# Patient Record
Sex: Female | Born: 1950 | Race: White | Hispanic: No | Marital: Married | Smoking: Never smoker
Health system: Southern US, Community
[De-identification: ages and names within clinical notes are randomized; demographics above are authoritative.]

## PROBLEM LIST (undated history)

## (undated) DIAGNOSIS — N393 Stress incontinence (female) (male): Secondary | ICD-10-CM

## (undated) DIAGNOSIS — D649 Anemia, unspecified: Secondary | ICD-10-CM

## (undated) DIAGNOSIS — M1990 Unspecified osteoarthritis, unspecified site: Secondary | ICD-10-CM

## (undated) DIAGNOSIS — Z9229 Personal history of other drug therapy: Secondary | ICD-10-CM

## (undated) DIAGNOSIS — I10 Essential (primary) hypertension: Secondary | ICD-10-CM

## (undated) HISTORY — DX: Personal history of other drug therapy: Z92.29

## (undated) HISTORY — DX: Essential (primary) hypertension: I10

## (undated) HISTORY — PX: SPINE SURGERY: SHX786

## (undated) HISTORY — DX: Anemia, unspecified: D64.9

## (undated) HISTORY — DX: Unspecified osteoarthritis, unspecified site: M19.90

## (undated) HISTORY — PX: ABDOMINAL HYSTERECTOMY: SHX81

## (undated) HISTORY — PX: LAMINECTOMY: SHX219

## (undated) HISTORY — PX: OOPHORECTOMY: SHX86

## (undated) HISTORY — DX: Stress incontinence (female) (male): N39.3

---
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
## 1980-07-13 HISTORY — PX: BREAST ENHANCEMENT SURGERY: SHX7

## 1980-07-13 HISTORY — PX: PARTIAL HYSTERECTOMY: SHX80

## 1989-07-13 HISTORY — PX: CHOLECYSTECTOMY: SHX55

## 1997-07-13 HISTORY — PX: BLADDER REPAIR: SHX76

## 2002-07-13 HISTORY — PX: BLADDER REPAIR: SHX76

## 2002-10-24 ENCOUNTER — Encounter: Admission: RE | Admit: 2002-10-24 | Discharge: 2002-10-24 | Payer: Self-pay | Admitting: Family Medicine

## 2002-10-24 ENCOUNTER — Encounter: Payer: Self-pay | Admitting: Family Medicine

## 2007-07-14 LAB — HM COLONOSCOPY

## 2009-12-30 ENCOUNTER — Encounter: Admission: RE | Admit: 2009-12-30 | Discharge: 2009-12-30 | Payer: Self-pay | Admitting: Family Medicine

## 2010-11-12 ENCOUNTER — Other Ambulatory Visit: Payer: Self-pay | Admitting: Family Medicine

## 2010-11-12 DIAGNOSIS — M545 Low back pain, unspecified: Secondary | ICD-10-CM

## 2010-11-13 ENCOUNTER — Ambulatory Visit
Admission: RE | Admit: 2010-11-13 | Discharge: 2010-11-13 | Disposition: A | Discharge status: Home / Self Care | Payer: BC Managed Care – PPO | Source: Ambulatory Visit | Attending: Family Medicine | Admitting: Family Medicine

## 2010-11-13 DIAGNOSIS — M545 Low back pain: Secondary | ICD-10-CM

## 2010-11-14 ENCOUNTER — Other Ambulatory Visit: Payer: Self-pay

## 2012-11-09 ENCOUNTER — Other Ambulatory Visit (HOSPITAL_COMMUNITY): Payer: Self-pay | Admitting: Family Medicine

## 2012-11-11 ENCOUNTER — Ambulatory Visit
Admission: RE | Admit: 2012-11-11 | Discharge: 2012-11-11 | Disposition: A | Discharge status: Home / Self Care | Payer: BC Managed Care – PPO | Source: Ambulatory Visit | Attending: Family Medicine | Admitting: Family Medicine

## 2012-11-11 ENCOUNTER — Encounter: Payer: BC Managed Care – PPO | Admitting: Internal Medicine

## 2012-11-11 ENCOUNTER — Ambulatory Visit (INDEPENDENT_AMBULATORY_CARE_PROVIDER_SITE_OTHER): Payer: BC Managed Care – PPO | Admitting: Internal Medicine

## 2012-11-11 DIAGNOSIS — Z23 Encounter for immunization: Secondary | ICD-10-CM
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
## 2013-05-26 ENCOUNTER — Other Ambulatory Visit (HOSPITAL_COMMUNITY): Payer: Self-pay | Admitting: Family Medicine

## 2013-05-26 ENCOUNTER — Ambulatory Visit
Admission: RE | Admit: 2013-05-26 | Discharge: 2013-05-26 | Disposition: A | Discharge status: Home / Self Care | Payer: BC Managed Care – PPO | Source: Ambulatory Visit | Attending: Family Medicine | Admitting: Family Medicine

## 2013-05-26 DIAGNOSIS — R519 Headache, unspecified: Secondary | ICD-10-CM

## 2013-05-26 DIAGNOSIS — R42 Dizziness and giddiness: Secondary | ICD-10-CM

## 2013-05-26 DIAGNOSIS — H9201 Otalgia, right ear: Secondary | ICD-10-CM

## 2013-05-26 IMAGING — CT CT HEAD WO/W CM
1 of 2 series · 13 of 30 positions shown, 17 images · IV contrast (omnipaque)
Comparison: None.

CLINICAL DATA: Vertigo.  Facial pain.

EXAM:
CT HEAD WITHOUT AND WITH CONTRAST
TECHNIQUE: Contiguous axial images were obtained from the base of the skull
through the vertex without and with intravenous contrast
CONTRAST:  75mL OMNIPAQUE IOHEXOL 300 MG/ML  SOLN

[Series 2: head w/o · axial · non-contrast · 0.44mm/px · z∈[+31,+166]mm · 13 of 32 slices shown, 17 images]
[im 3/32  brain]
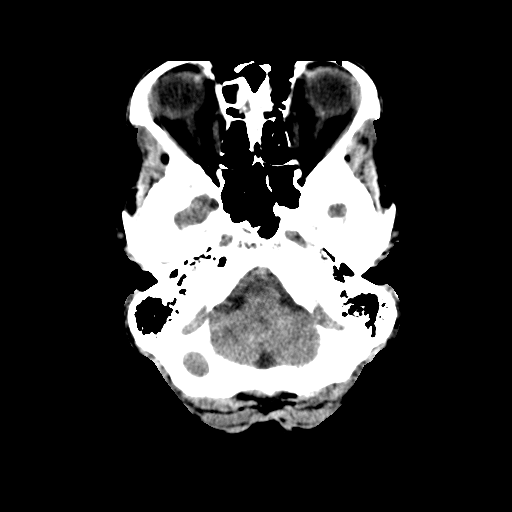
[im 3/32  bone]
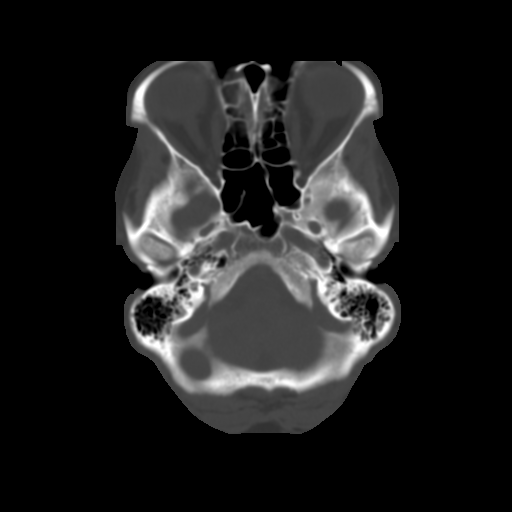
[im 5/32  brain]
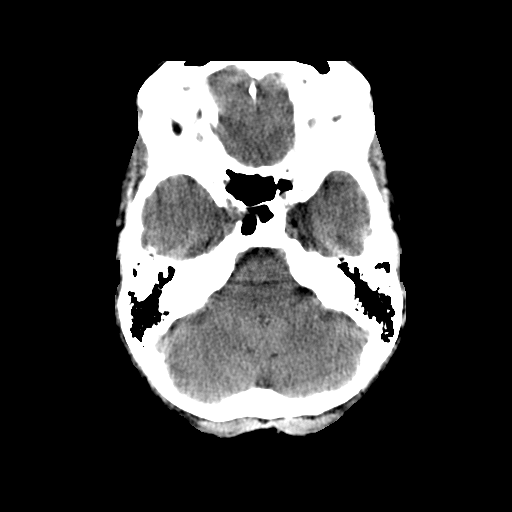
[im 7/32  brain]
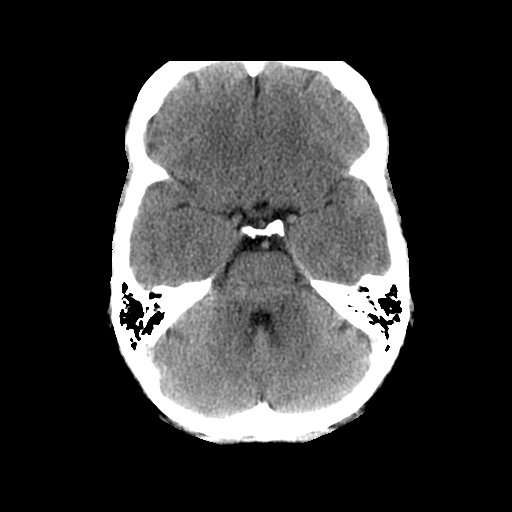
[im 9/32  brain]
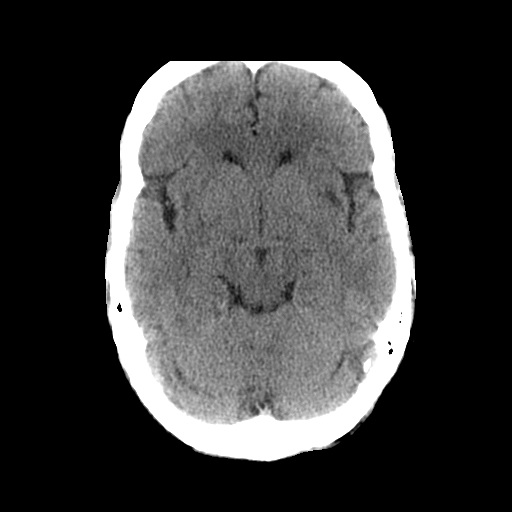
[im 12/32  brain]
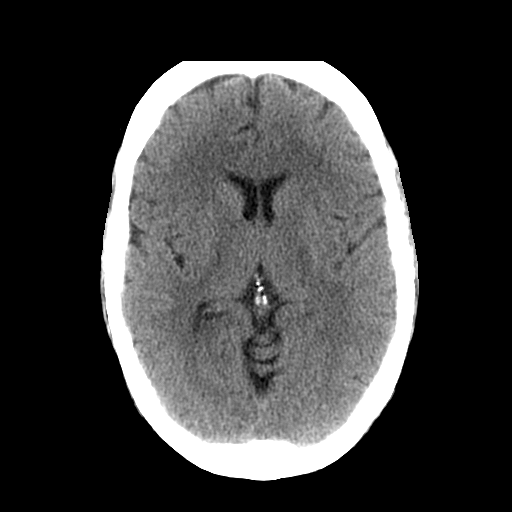
[im 12/32  bone]
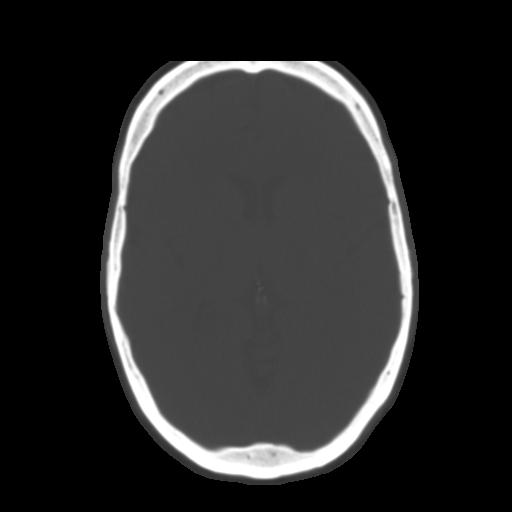
[im 14/32  brain]
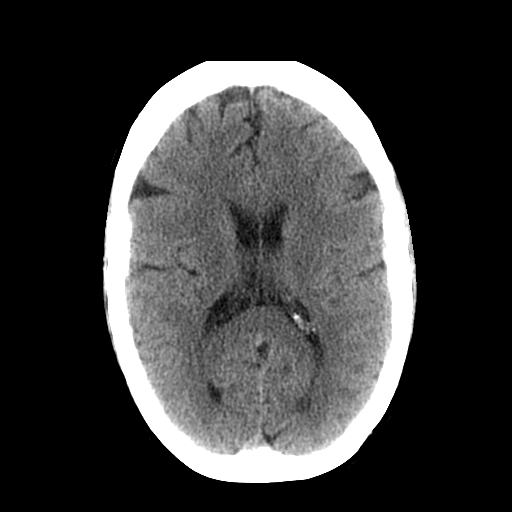
[im 16/32  brain]
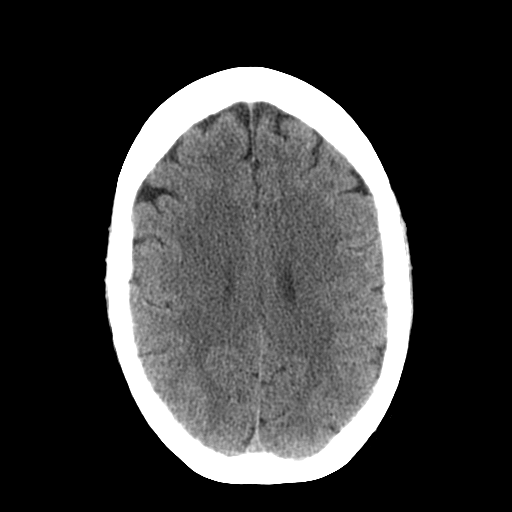
[im 18/32  brain]
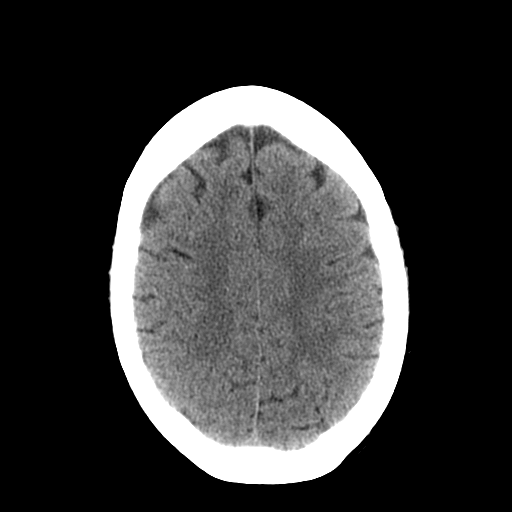
[im 20/32  brain]
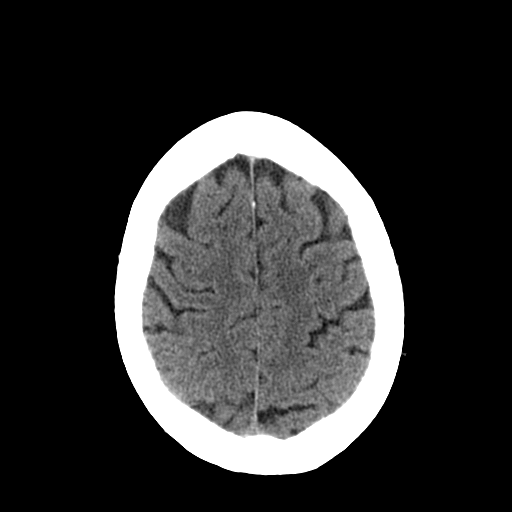
[im 20/32  bone]
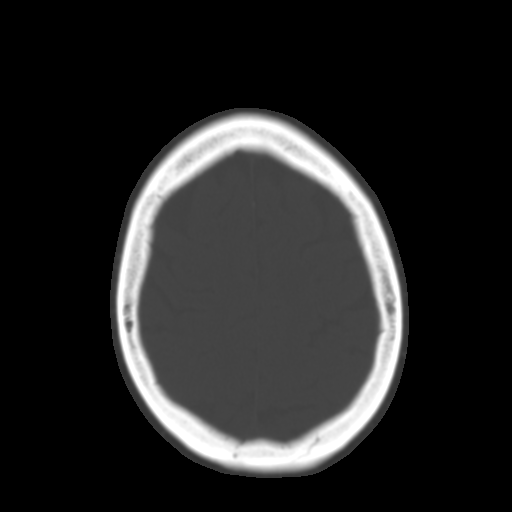
[im 23/32  brain]
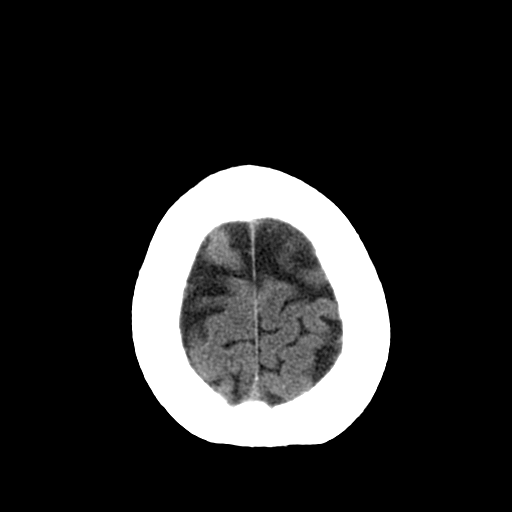
[im 25/32  brain]
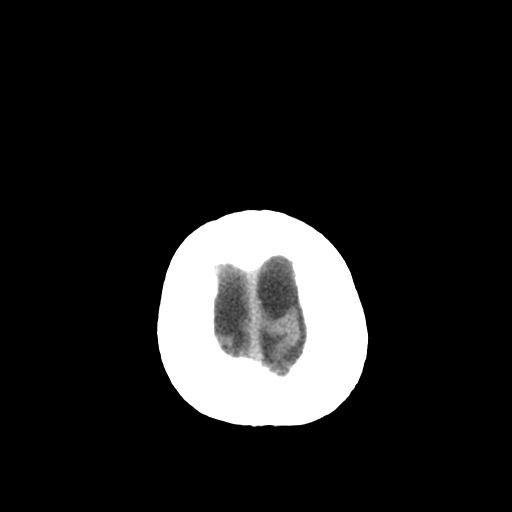
[im 27/32  brain]
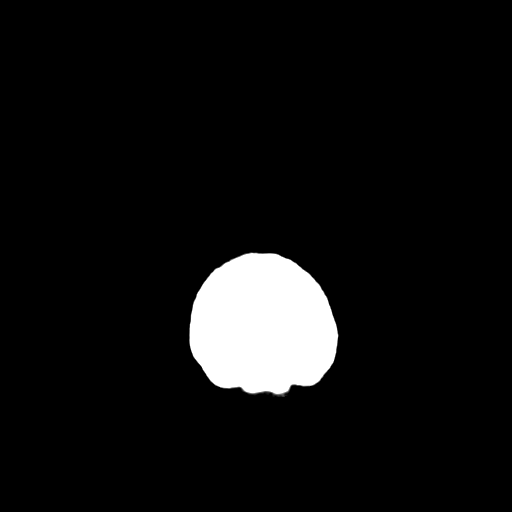
[im 29/32  brain]
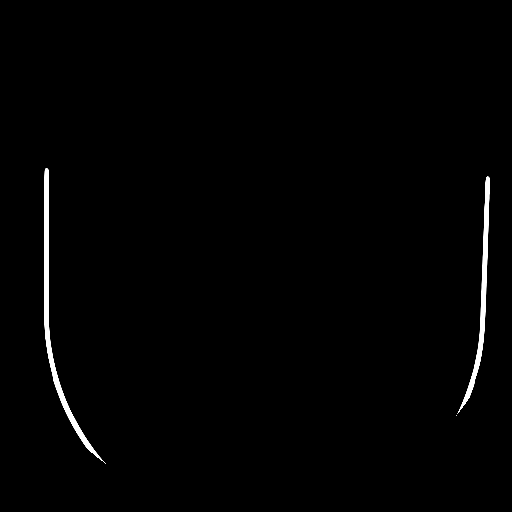
[im 29/32  bone]
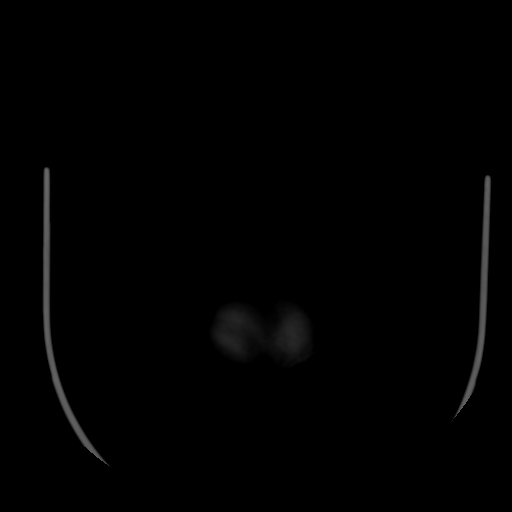

[13 of 30 positions shown; findings below may reference images not displayed]

FINDINGS: Ventricle size is normal.  Cerebral volume is normal for age.

Negative for acute or chronic infarction. Negative for hemorrhage or
mass.

Normal enhancement following contrast administration.

The skull is normal.
IMPRESSION: Normal

## 2013-05-26 MED ORDER — IOHEXOL 300 MG/ML  SOLN
75.0000 mL | Freq: Once | INTRAMUSCULAR | Status: AC | PRN
  Administered 2013-05-26: 75 mL via INTRAVENOUS

## 2014-10-01 LAB — HM MAMMOGRAPHY

## 2015-05-10 ENCOUNTER — Other Ambulatory Visit (HOSPITAL_COMMUNITY): Payer: Self-pay | Admitting: Family Medicine

## 2015-05-10 DIAGNOSIS — M419 Scoliosis, unspecified: Secondary | ICD-10-CM

## 2015-05-20 ENCOUNTER — Ambulatory Visit
Admission: RE | Admit: 2015-05-20 | Discharge: 2015-05-20 | Disposition: A | Discharge status: Home / Self Care | Payer: BLUE CROSS/BLUE SHIELD | Source: Ambulatory Visit | Attending: Family Medicine | Admitting: Family Medicine

## 2015-05-20 DIAGNOSIS — M419 Scoliosis, unspecified: Secondary | ICD-10-CM

## 2015-05-20 MED ORDER — GADOBENATE DIMEGLUMINE 529 MG/ML IV SOLN: 13.0000 mL | Freq: Once | INTRAVENOUS | Status: DC | PRN

## 2015-09-23 DIAGNOSIS — Z1231 Encounter for screening mammogram for malignant neoplasm of breast: Secondary | ICD-10-CM | POA: Diagnosis not present

## 2015-12-18 ENCOUNTER — Encounter: Payer: Self-pay | Admitting: Family Medicine

## 2015-12-18 ENCOUNTER — Ambulatory Visit (INDEPENDENT_AMBULATORY_CARE_PROVIDER_SITE_OTHER): Payer: Medicare Other | Admitting: Family Medicine

## 2015-12-18 VITALS — BP 118/78 | HR 67 | Temp 98.0°F | Ht 64.0 in | Wt 145.2 lb

## 2015-12-18 DIAGNOSIS — M549 Dorsalgia, unspecified: Secondary | ICD-10-CM | POA: Diagnosis not present

## 2015-12-18 DIAGNOSIS — R300 Dysuria: Secondary | ICD-10-CM | POA: Diagnosis not present

## 2015-12-18 DIAGNOSIS — I83813 Varicose veins of bilateral lower extremities with pain: Secondary | ICD-10-CM

## 2015-12-18 DIAGNOSIS — G8929 Other chronic pain: Secondary | ICD-10-CM

## 2015-12-18 DIAGNOSIS — R609 Edema, unspecified: Secondary | ICD-10-CM

## 2015-12-18 DIAGNOSIS — Z7189 Other specified counseling: Secondary | ICD-10-CM

## 2015-12-18 DIAGNOSIS — N393 Stress incontinence (female) (male): Secondary | ICD-10-CM

## 2015-12-18 NOTE — Patient Instructions (Addendum)
Rosaria Ferries will call about your referral. Take care.  Glad to see you.  I'll await your records.

## 2015-12-18 NOTE — Progress Notes (Signed)
Pre visit review using our clinic review tool, if applicable. No additional management support is needed unless otherwise documented below in the visit note.  New patient.   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  Has seen vascular surgery prev, hasn't opted for tx yet.  D/w pt.   Back pain.  Has seen neurosurgery, s/p mult surgeries.  Still with L lateral lower leg pain that isn't from claudication or calf muscle pain.  Noted with position change.  She is able to put up with current level of discomfort.  Still walking for exercise, several miles a day.  No new sx, no weakness.    Dysuria.  Urgency.  Some stress incontinence.  Wanted to see uro.  Has had bladder sling prev.  Doesn't have sx consistent with UTI.    She takes HCTZ with K for edema, not elevated BP.   Old records are in route to clinic per patient, I will await those.   PMH and SH reviewed  ROS: Per HPI unless specifically indicated in ROS section   Meds, vitals, and allergies reviewed.   GEN: nad, alert and oriented HEENT: mucous membranes moist NECK: supple w/o LA CV: rrr.  PULM: ctab, no inc wob ABD: soft, +bs EXT: no edema SKIN: no acute rash but varicose veins noted Back with midline scars noted.  Normal DP pulses

## 2015-12-19 ENCOUNTER — Encounter: Payer: Self-pay | Admitting: Family Medicine

## 2015-12-19 DIAGNOSIS — I83819 Varicose veins of unspecified lower extremities with pain: Secondary | ICD-10-CM | POA: Insufficient documentation

## 2015-12-19 DIAGNOSIS — R609 Edema, unspecified: Secondary | ICD-10-CM | POA: Insufficient documentation

## 2015-12-19 DIAGNOSIS — N393 Stress incontinence (female) (male): Secondary | ICD-10-CM | POA: Insufficient documentation

## 2015-12-19 DIAGNOSIS — G8929 Other chronic pain: Secondary | ICD-10-CM | POA: Insufficient documentation

## 2015-12-19 DIAGNOSIS — M549 Dorsalgia, unspecified: Secondary | ICD-10-CM

## 2015-12-19 DIAGNOSIS — Z7189 Other specified counseling: Secondary | ICD-10-CM | POA: Insufficient documentation

## 2015-12-19 NOTE — Assessment & Plan Note (Signed)
Continue walking, stretching.  Update me as needed.  She is no enthused about another procedure.

## 2015-12-19 NOTE — Assessment & Plan Note (Signed)
Continue HCTZ/K and awaiting records.

## 2015-12-19 NOTE — Assessment & Plan Note (Signed)
Refer to [facility].  >30 minutes spent in face to face time with patient, >50% spent in counselling or coordination of care

## 2015-12-19 NOTE — Assessment & Plan Note (Signed)
She'll consider when she wants to have vascular tx done.  Not emergent.

## 2015-12-25 ENCOUNTER — Encounter: Payer: Self-pay | Admitting: Family Medicine

## 2015-12-25 ENCOUNTER — Telehealth: Payer: Self-pay | Admitting: Family Medicine

## 2015-12-25 NOTE — Telephone Encounter (Signed)
Saw in the old records about colonoscopy 2009 with 10 year repeat.  I didn't see the report though.  I entered 2009 in EMR.  Didn't see PNA/zosta/tetanus dates.  Didn't see HCV, DXA, or most recent mammogram.  If she has records of any, then let me know.   Labs reviewed from last few years, fine.  MRI reports reviewed.  Thanks.

## 2015-12-26 NOTE — Telephone Encounter (Signed)
Noted.  If she wants an order for DXA or HCV testing then let me know and I'll put either/both in.  This isn't emergent.  Thanks.

## 2015-12-26 NOTE — Telephone Encounter (Signed)
Copy of last Tetanus and mammogram placed in Dr. Josefine Class In [place].

## 2015-12-26 NOTE — Telephone Encounter (Signed)
Patient states she will get the colonoscopy report and fax it to [place] as well as documentation of her last mammogram which was in March.  She says that she has had DXA's previously but probably not within the last 2-3 years.  Patient says she probably does need the PNA but wants to wait until Fall to get it.  Patient refuses Zostavax and says her Tetanus is up to date because of a recent mission trip.

## 2015-12-26 NOTE — Telephone Encounter (Signed)
Left message on patient's voicemail to return call

## 2015-12-27 NOTE — Telephone Encounter (Signed)
Patient advised and says that when she gets her annual lab work at [facility], she will call for an order and remind [place] to add the Hep C Panel and will also get a copy of the colonoscopy faxed to [place].  She will talk to you about the DXA scan at her next CPE.

## 2016-01-03 ENCOUNTER — Encounter: Payer: Self-pay | Admitting: Family Medicine

## 2016-01-03 LAB — GLUCOSE (CC13)
ALT: 15
AST: 22 U/L
Cholesterol, Total: 174
Creatinine, Ser: 0.67
GLUCOSE: 82
HDL: 60
HEMOGLOBIN: 13.4
LDL Calculated: 102 mg/dL
TSH: 2.47
Triglycerides: 61

## 2016-03-23 DIAGNOSIS — N952 Postmenopausal atrophic vaginitis: Secondary | ICD-10-CM | POA: Diagnosis not present

## 2016-03-23 DIAGNOSIS — N3946 Mixed incontinence: Secondary | ICD-10-CM | POA: Diagnosis not present

## 2016-05-28 DIAGNOSIS — Z23 Encounter for immunization: Secondary | ICD-10-CM | POA: Diagnosis not present

## 2016-09-23 ENCOUNTER — Encounter: Payer: Self-pay | Admitting: Family Medicine

## 2016-09-23 DIAGNOSIS — Z780 Asymptomatic menopausal state: Secondary | ICD-10-CM | POA: Diagnosis not present

## 2016-09-23 DIAGNOSIS — Z1231 Encounter for screening mammogram for malignant neoplasm of breast: Secondary | ICD-10-CM | POA: Diagnosis not present

## 2016-09-29 ENCOUNTER — Encounter: Payer: Self-pay | Admitting: Family Medicine

## 2016-10-09 DIAGNOSIS — H00022 Hordeolum internum right lower eyelid: Secondary | ICD-10-CM | POA: Diagnosis not present

## 2016-10-12 ENCOUNTER — Other Ambulatory Visit: Payer: Self-pay | Admitting: Family Medicine

## 2016-10-12 DIAGNOSIS — R609 Edema, unspecified: Secondary | ICD-10-CM

## 2016-10-12 DIAGNOSIS — Z119 Encounter for screening for infectious and parasitic diseases, unspecified: Secondary | ICD-10-CM

## 2016-10-14 ENCOUNTER — Ambulatory Visit (INDEPENDENT_AMBULATORY_CARE_PROVIDER_SITE_OTHER): Payer: Medicare Other

## 2016-10-14 VITALS — BP 118/80 | HR 60 | Temp 97.9°F | Ht 64.0 in | Wt 158.8 lb

## 2016-10-14 DIAGNOSIS — Z23 Encounter for immunization: Secondary | ICD-10-CM | POA: Diagnosis not present

## 2016-10-14 DIAGNOSIS — R609 Edema, unspecified: Secondary | ICD-10-CM | POA: Diagnosis not present

## 2016-10-14 DIAGNOSIS — Z1159 Encounter for screening for other viral diseases: Secondary | ICD-10-CM

## 2016-10-14 DIAGNOSIS — E663 Overweight: Secondary | ICD-10-CM | POA: Diagnosis not present

## 2016-10-14 DIAGNOSIS — Z119 Encounter for screening for infectious and parasitic diseases, unspecified: Secondary | ICD-10-CM | POA: Diagnosis not present

## 2016-10-14 DIAGNOSIS — Z0000 Encounter for general adult medical examination without abnormal findings: Secondary | ICD-10-CM | POA: Diagnosis not present

## 2016-10-14 LAB — LIPID PANEL
CHOL/HDL RATIO: 3
CHOLESTEROL: 184 mg/dL (ref 0–200)
HDL: 65.5 mg/dL (ref >39.00)
LDL CALC: 108 mg/dL — AB (ref 0–99)
NONHDL: 118.34
Triglycerides: 50 mg/dL (ref 0.0–149.0)
VLDL: 10 mg/dL (ref 0.0–40.0)

## 2016-10-14 LAB — TSH: TSH: 1.46 u[IU]/mL (ref 0.35–4.50)

## 2016-10-14 LAB — COMPREHENSIVE METABOLIC PANEL
ALT: 13 U/L (ref 0–35)
AST: 19 U/L (ref 0–37)
Albumin: 3.9 g/dL (ref 3.5–5.2)
Alkaline Phosphatase: 61 U/L (ref 39–117)
BILIRUBIN TOTAL: 0.9 mg/dL (ref 0.2–1.2)
BUN: 17 mg/dL (ref 6–23)
CALCIUM: 9.1 mg/dL (ref 8.4–10.5)
CO2: 31 meq/L (ref 19–32)
Chloride: 103 mEq/L (ref 96–112)
Creatinine, Ser: 0.67 mg/dL (ref 0.40–1.20)
GFR: 93.06 mL/min (ref >60.00)
Glucose, Bld: 87 mg/dL (ref 70–99)
Potassium: 3.6 mEq/L (ref 3.5–5.1)
Sodium: 139 mEq/L (ref 135–145)
Total Protein: 6.5 g/dL (ref 6.0–8.3)

## 2016-10-14 LAB — CBC
HCT: 36.9 % (ref 36.0–46.0)
Hemoglobin: 12.3 g/dL (ref 12.0–15.0)
MCHC: 33.4 g/dL (ref 30.0–36.0)
MCV: 91.3 fl (ref 78.0–100.0)
PLATELETS: 246 10*3/uL (ref 150.0–400.0)
RBC: 4.04 Mil/uL (ref 3.87–5.11)
RDW: 13.3 % (ref 11.5–15.5)
WBC: 5 10*3/uL (ref 4.0–10.5)

## 2016-10-14 NOTE — Progress Notes (Signed)
Pre visit review using our clinic review tool, if applicable. No additional management support is needed unless otherwise documented below in the visit note. 

## 2016-10-14 NOTE — Patient Instructions (Signed)
Stacie Huff , Thank you for taking time to come for your Medicare Wellness Visit. I appreciate your ongoing commitment to your health goals. Please review the following plan we discussed and let me know if I can assist you in the future.   These are the goals we discussed: Goals    . Increase physical activity          Starting 10/14/2016, I will continue to walk at least 5 miles 3-5 days per week.        This is a list of the screening recommended for you and due dates:  Health Maintenance  Topic Date Due  . Tetanus Vaccine  10/14/2017*  . Pneumonia vaccines (1 of 2 - PCV13) 10/14/2017*  . Flu Shot  02/10/2017  . Colon Cancer Screening  07/13/2017  . Mammogram  09/24/2018  . DEXA scan (bone density measurement)  Completed  .  Hepatitis C: One time screening is recommended by Center for Disease Control  (CDC) for  adults born from [birth year] through 1967.   Completed  *Topic was postponed. The date shown is not the original due date.   Preventive Care for Adults  A healthy lifestyle and preventive care can promote health and wellness. Preventive health guidelines for adults include the following key practices.  . A routine yearly physical is a good way to check with your health care provider about your health and preventive screening. It is a chance to share any concerns and updates on your health and to receive a thorough exam.  . Visit your dentist for a routine exam and preventive care every 6 months. Brush your teeth twice a day and floss once a day. Good oral hygiene prevents tooth decay and gum disease.  . The frequency of eye exams is based on your age, health, family medical history, use  of contact lenses, and other factors. Follow your health care provider's ecommendations for frequency of eye exams.  . Eat a healthy diet. Foods like vegetables, fruits, whole grains, low-fat dairy products, and lean protein foods contain the nutrients you need without too many calories. Decrease  your intake of foods high in solid fats, added sugars, and salt. Eat the right amount of calories for you. Get information about a proper diet from your health care provider, if necessary.  . Regular physical exercise is one of the most important things you can do for your health. Most adults should get at least 150 minutes of moderate-intensity exercise (any activity that increases your heart rate and causes you to sweat) each week. In addition, most adults need muscle-strengthening exercises on 2 or more days a week.  Silver Sneakers may be a benefit available to you. To determine eligibility, you may visit the website: www.silversneakers.com or contact program at 936-481-3293 Mon-Fri between 8AM-8PM.   . Maintain a healthy weight. The body mass index (BMI) is a screening tool to identify possible weight problems. It provides an estimate of body fat based on height and weight. Your health care provider can find your BMI and can help you achieve or maintain a healthy weight.   For adults 20 years and older: ? A BMI below 18.5 is considered underweight. ? A BMI of 18.5 to 24.9 is normal. ? A BMI of 25 to 29.9 is considered overweight. ? A BMI of 30 and above is considered obese.   . Maintain normal blood lipids and cholesterol levels by exercising and minimizing your intake of saturated fat. Eat a balanced diet  with plenty of fruit and vegetables. Blood tests for lipids and cholesterol should begin at age 55 and be repeated every 5 years. If your lipid or cholesterol levels are high, you are over 50, or you are at high risk for heart disease, you may need your cholesterol levels checked more frequently. Ongoing high lipid and cholesterol levels should be treated with medicines if diet and exercise are not working.  . If you smoke, find out from your health care provider how to quit. If you do not use tobacco, please do not start.  . If you choose to drink alcohol, please do not consume more than  2 drinks per day. One drink is considered to be 12 ounces (355 mL) of beer, 5 ounces (148 mL) of wine, or 1.5 ounces (44 mL) of liquor.  . If you are 72-54 years old, ask your health care provider if you should take aspirin to prevent strokes.  . Use sunscreen. Apply sunscreen liberally and repeatedly throughout the day. You should seek shade when your shadow is shorter than you. Protect yourself by wearing long sleeves, pants, a wide-brimmed hat, and sunglasses year round, whenever you are outdoors.  . Once a month, do a whole body skin exam, using a mirror to look at the skin on your back. Tell your health care provider of new moles, moles that have irregular borders, moles that are larger than a pencil eraser, or moles that have changed in shape or color.

## 2016-10-14 NOTE — Progress Notes (Signed)
Subjective:   Stacie Huff is a 66 y.o. female who presents for an Initial Medicare Annual Wellness Visit.  Review of Systems    N/A  Cardiac Risk Factors include: advanced age (>72men, >58 women)     Objective:    Today's Vitals   10/14/16 1123  BP: 118/80  Pulse: 60  Temp: 97.9 F (36.6 C)  TempSrc: Oral  SpO2: 97%  Weight: 158 lb 12 oz (72 kg)  Height: 5\' 4"  (1.626 m)  PainSc: 0-No pain   Body mass index is 27.25 kg/m.   Current Medications (verified) Outpatient Encounter Prescriptions as of 10/14/2016  Medication Sig  . Calcium Carb-Cholecalciferol (CALCIUM 600/VITAMIN D3 PO) Take by mouth daily.  Marland Kitchen estradiol (ESTRACE) 1 MG tablet Take 1 mg by mouth daily.  . hydrochlorothiazide (HYDRODIURIL) 25 MG tablet Take 25 mg by mouth daily.  Marland Kitchen NAPROXEN PO Take 2 tablets by mouth as needed.  Marland Kitchen OVER THE COUNTER MEDICATION daily. Fish oil 1200, omega 3- 600 mg, with vitamin d 3- 2000 units  . potassium chloride (K-DUR) 10 MEQ tablet Take 10 mEq by mouth daily.  . bimatoprost (LATISSE) 0.03 % ophthalmic solution Place into both eyes at bedtime. Place one drop on applicator and apply evenly along the skin of the upper eyelid at base of eyelashes once daily at bedtime; repeat procedure for second eye (use a clean applicator).  . [DISCONTINUED] celecoxib (CELEBREX) 200 MG capsule Take 200 mg by mouth daily.   No facility-administered encounter medications on file as of 10/14/2016.     Allergies (verified) Demerol [meperidine]; Gabapentin; and Vicodin [hydrocodone-acetaminophen]   History: Past Medical History:  Diagnosis Date  . Arthritis   . Urine, incontinence, stress female    Past Surgical History:  Procedure Laterality Date  . APPENDECTOMY  1960  . BLADDER REPAIR  1999  . BLADDER REPAIR  2004  . BREAST ENHANCEMENT SURGERY  1982  . CHOLECYSTECTOMY  1991  . LAMINECTOMY  1990, 2000 x 2  . PARTIAL HYSTERECTOMY  1982  . TONSILLECTOMY AND ADENOIDECTOMY  1956   Family  History  Problem Relation Age of Onset  . Sarcoidosis Mother   . Colon cancer Father     at age 59  . Breast cancer Neg Hx    Social History   Occupational History  . Not on file.   Social History Main Topics  . Smoking status: Never Smoker  . Smokeless tobacco: Never Used  . Alcohol use No  . Drug use: No  . Sexual activity: Not on file    Tobacco Counseling Counseling given: No   Activities of Daily Living In your present state of health, do you have any difficulty performing the following activities: 10/14/2016  Hearing? N  Vision? N  Difficulty concentrating or making decisions? N  Walking or climbing stairs? N  Dressing or bathing? N  Doing errands, shopping? N  Preparing Food and eating ? N  Using the Toilet? N  In the past six months, have you accidently leaked urine? Y  Do you have problems with loss of bowel control? N  Managing your Medications? N  Managing your Finances? N  Housekeeping or managing your Housekeeping? N  Some recent data might be hidden    Immunizations and Health Maintenance Immunization History  Administered Date(s) Administered  . Influenza-Unspecified 04/12/2016  . Meningococcal Polysaccharide 11/11/2012  . Yellow Fever 11/11/2012   There are no preventive care reminders to display for this patient.  Patient Care Team:  Tonia Ghent, MD as PCP - General (Family Medicine)     Assessment:   This is a routine wellness examination for Stacie Huff.   Hearing/Vision screen  Hearing Screening   125Hz  250Hz  500Hz  1000Hz  2000Hz  3000Hz  4000Hz  6000Hz  8000Hz   Right ear:   0 0 40  0    Left ear:   0 0 40  0    Vision Screening Comments: Last vision exam in May 2017 with Dr. Melida Gimenez  Dietary issues and exercise activities discussed: Current Exercise Habits: Home exercise routine, Time (Minutes): 60 (5 miles 3-5 days per week), Frequency (Times/Week): 4, Weekly Exercise (Minutes/Week): 240, Intensity: Mild, Exercise limited by: None  identified  Goals    . Increase physical activity          Starting 10/14/2016, I will continue to walk at least 5 miles 3-5 days per week.       Depression Screen PHQ 2/9 Scores 10/14/2016  PHQ - 2 Score 0    Fall Risk Fall Risk  10/14/2016  Falls in the past year? No    Cognitive Function: MMSE - Mini Mental State Exam 10/14/2016  Orientation to time 5  Orientation to Place 5  Registration 3  Attention/ Calculation 0  Recall 3  Language- name 2 objects 0  Language- repeat 1  Language- follow 3 step command 3  Language- read & follow direction 0  Write a sentence 0  Copy design 0  Total score 20     PLEASE NOTE: A Mini-Cog screen was completed. Maximum score is 20. A value of 0 denotes this part of Folstein MMSE was not completed or the patient failed this part of the Mini-Cog screening.   Mini-Cog Screening Orientation to Time - Max 5 pts Orientation to Place - Max 5 pts Registration - Max 3 pts Recall - Max 3 pts Language Repeat - Max 1 pts Language Follow 3 Step Command - Max 3 pts     Screening Tests Health Maintenance  Topic Date Due  . TETANUS/TDAP  10/14/2017 (Originally 11/16/1969)  . PNA vac Low Risk Adult (1 of 2 - PCV13) 10/14/2017 (Originally 11/15/2013)  . INFLUENZA VACCINE  02/10/2017  . COLONOSCOPY  07/13/2017  . MAMMOGRAM  09/24/2018  . DEXA SCAN  Completed  . Hepatitis C Screening  Completed      Plan:     I have personally reviewed and addressed the Medicare Annual Wellness questionnaire and have noted the following in the patient's chart:  A. Medical and social history B. Use of alcohol, tobacco or illicit drugs  C. Current medications and supplements D. Functional ability and status E.  Nutritional status F.  Physical activity G. Advance directives H. List of other physicians I.  Hospitalizations, surgeries, and ER visits in previous 12 months J.  [place] to include hearing, vision, cognitive, depression L. Referrals and  appointments - none  In addition, I have reviewed and discussed with patient certain preventive protocols, quality metrics, and best practice recommendations. A written personalized care plan for preventive services as well as general preventive health recommendations were provided to patient.  See attached scanned questionnaire for additional information.   Signed,   Lindell Noe, MHA, BS, LPN Health Coach

## 2016-10-14 NOTE — Progress Notes (Signed)
PCP notes:   Health maintenance:  Tetanus- postponed/insurance  PNA vaccine - postponed/pt will take same time as Tetanus  Hep C screening - completed  Flu vaccine - per pt, completed in Oct 2017  Abnormal screenings:   Hearing - failed  Patient concerns:   None  Nurse concerns:  None  Next PCP appt:   10/21/16 @ 1400

## 2016-10-15 LAB — HEPATITIS C ANTIBODY: HCV Ab: NEGATIVE

## 2016-10-16 NOTE — Progress Notes (Signed)
I reviewed health advisor's note, was available for consultation, and agree with documentation and plan.  

## 2016-10-21 ENCOUNTER — Encounter: Payer: Self-pay | Admitting: Family Medicine

## 2016-10-21 ENCOUNTER — Ambulatory Visit (INDEPENDENT_AMBULATORY_CARE_PROVIDER_SITE_OTHER): Payer: Medicare Other | Admitting: Family Medicine

## 2016-10-21 VITALS — BP 112/72 | HR 69 | Temp 97.7°F | Ht 64.0 in | Wt 159.8 lb

## 2016-10-21 DIAGNOSIS — R609 Edema, unspecified: Secondary | ICD-10-CM

## 2016-10-21 DIAGNOSIS — N393 Stress incontinence (female) (male): Secondary | ICD-10-CM

## 2016-10-21 DIAGNOSIS — G8929 Other chronic pain: Secondary | ICD-10-CM

## 2016-10-21 DIAGNOSIS — M549 Dorsalgia, unspecified: Secondary | ICD-10-CM

## 2016-10-21 DIAGNOSIS — R635 Abnormal weight gain: Secondary | ICD-10-CM | POA: Diagnosis not present

## 2016-10-21 DIAGNOSIS — Z79890 Hormone replacement therapy (postmenopausal): Secondary | ICD-10-CM | POA: Diagnosis not present

## 2016-10-21 DIAGNOSIS — Z0000 Encounter for general adult medical examination without abnormal findings: Secondary | ICD-10-CM

## 2016-10-21 MED ORDER — POTASSIUM CHLORIDE ER 10 MEQ PO TBCR: 10.0000 meq | EXTENDED_RELEASE_TABLET | Freq: Every day | ORAL | 3 refills | Status: DC

## 2016-10-21 MED ORDER — HYDROCHLOROTHIAZIDE 25 MG PO TABS: 25.0000 mg | ORAL_TABLET | Freq: Every day | ORAL | 3 refills | Status: DC

## 2016-10-21 MED ORDER — ESTRADIOL 1 MG PO TABS: 1.0000 mg | ORAL_TABLET | Freq: Every day | ORAL | 3 refills | Status: DC

## 2016-10-21 NOTE — Patient Instructions (Signed)
Take care.  Glad to see you.  Try to taper down on the HRT.  Thanks for getting a tetanus and PNA shot.  Update me as needed.

## 2016-10-21 NOTE — Progress Notes (Signed)
Pre visit review using our clinic review tool, if applicable. No additional management support is needed unless otherwise documented below in the visit note. 

## 2016-10-21 NOTE — Progress Notes (Signed)
Flu vaccine - per pt, completed in Oct 2017 Tetanus done 2018 at pharmacy 10/14/16 PNA vaccine - done at pharmacy 10/14/16 Shingles d/w pt.  Encouraged.   Colonoscopy not due until 2019 Mammogram and DXA up to date D/w pt.  If patient incapacitated, then would have her husband designated.   Hep C screening - completed Hearing - failed.  D/w pt.  Hearing declined.    Edema controlled with HCTZ, daily with K.  BMET wnl.  No ADE on medication.  HRT d/w pt.  She has tried to decrease her HRT and had intolerate HA and insomnia.  She wanted to continue, risk/benefit d/w pt. We agreed to have her try to taper once a year, trying alternating doses, to get the lowest dose possible.     She has seen urology in the meantime.  She had trial of med w/ some relief but she did well enough, off med, with cutting out caffeine.  She wanted to continue as is.    Her weight is up slightly.  She hadn't been exercising as much to help with leg pain.  She stopped exercising and her legs got better.  In the meantime her weight is up some.  She was prev walking 7 miles a day, 7 days a week.  She didn't cut back on sweets in the meantime.  She cut out sugar for lent but "I have no willpower now for some reason."  Her mood is still good.  We discussed comfort foods, etc and diet changes.  We talked about that I have had patient who went to counseling about their relationship with foods.  D/w pt about totaling her calories in a day but that may be counterproductive.  She took off her fitbit since that was making her feel guilty.  We talked about a gradual increase/change in exercise to balance out her intake (which is still <2000 cal a day).    We discussed her labs, unremarkable.    Her pain is resolved in the meantime.  She is off celebrex, it didn't work well.  She takes 2 naprosyn in the meantime and she is doing well with that.  D/w pt about GI cautions.  She was able to waterski last summer, this is remarkable given her  age.    PMH and SH reviewed  ROS: Per HPI unless specifically indicated in ROS section   Meds, vitals, and allergies reviewed.   GEN: nad, alert and oriented HEENT: mucous membranes moist NECK: supple w/o LA CV: rrr PULM: ctab, no inc wob ABD: soft, +bs EXT: no edema

## 2016-10-22 DIAGNOSIS — R635 Abnormal weight gain: Secondary | ICD-10-CM | POA: Insufficient documentation

## 2016-10-22 DIAGNOSIS — Z0000 Encounter for general adult medical examination without abnormal findings: Secondary | ICD-10-CM | POA: Insufficient documentation

## 2016-10-22 DIAGNOSIS — Z79890 Hormone replacement therapy (postmenopausal): Secondary | ICD-10-CM | POA: Insufficient documentation

## 2016-10-22 NOTE — Assessment & Plan Note (Signed)
Controlled as is. Creatinine and potassium normal. No change in medication.

## 2016-10-22 NOTE — Assessment & Plan Note (Signed)
She is able to tolerate as is. Continue off medication. She will update me as needed.

## 2016-10-22 NOTE — Assessment & Plan Note (Signed)
She was previously walking up to 50 miles a week. She cut back a lot and that her back pain. She is not having symptoms at this point. Continue with exercise as tolerated.

## 2016-10-22 NOTE — Assessment & Plan Note (Signed)
Flu vaccine - per pt, completed in Oct 2017 Tetanus done 2018 at pharmacy 10/14/16 PNA vaccine - done at pharmacy 10/14/16 Shingles d/w pt.  Encouraged.   Colonoscopy not due until 2019 Mammogram and DXA up to date D/w pt.  If patient incapacitated, then would have her husband designated.   Hep C screening - completed Hearing - failed.  D/w pt.  Hearing declined.

## 2016-10-22 NOTE — Assessment & Plan Note (Signed)
HRT d/w pt.  She has tried to decrease her HRT and had intolerable HA and insomnia.  She wanted to continue, risk/benefit d/w pt. We agreed to have her try to taper once a year, trying alternating doses, to get the lowest dose possible.  She may need a very extended and slow taper to try to tolerate the change. She will update me as needed.  She has no absolute contraindication to HRT at this point. >25 minutes spent in face to face time with patient, >50% spent in counselling or coordination of care.

## 2016-10-22 NOTE — Assessment & Plan Note (Signed)
She put on about 10 pounds last year. She was previously walking about 50 miles a week. She cut back because of back/leg pain. She does not eat what is typically viewed as an excessive amount of calories but she did not cut back much her calories when she concurrently decreased exercise. Her weight has gone up some in the meantime. She is not obese. Discussed with patient about comfort eating, stress eating, her relationship with food. She will to try to gradually increase her exercise in the meantime. It may be that it is reasonable for her to discuss with counselor about her relationship food. Discussed. I gave her the name of a local counselor, Production assistant, radio. Pt can update me as needed, patient will consider in the meantime.

## 2016-11-02 ENCOUNTER — Encounter: Payer: Self-pay | Admitting: Family Medicine

## 2016-11-04 ENCOUNTER — Ambulatory Visit: Payer: Medicare Other | Admitting: Family Medicine

## 2017-02-25 DIAGNOSIS — M7061 Trochanteric bursitis, right hip: Secondary | ICD-10-CM | POA: Diagnosis not present

## 2017-02-25 DIAGNOSIS — M7631 Iliotibial band syndrome, right leg: Secondary | ICD-10-CM | POA: Diagnosis not present

## 2017-04-01 DIAGNOSIS — M7631 Iliotibial band syndrome, right leg: Secondary | ICD-10-CM | POA: Diagnosis not present

## 2017-04-01 DIAGNOSIS — M7061 Trochanteric bursitis, right hip: Secondary | ICD-10-CM | POA: Diagnosis not present

## 2017-04-05 DIAGNOSIS — Z23 Encounter for immunization: Secondary | ICD-10-CM | POA: Diagnosis not present

## 2017-08-02 ENCOUNTER — Encounter: Payer: Self-pay | Admitting: Family Medicine

## 2017-08-24 ENCOUNTER — Encounter: Payer: Self-pay | Admitting: Family Medicine

## 2017-08-24 ENCOUNTER — Ambulatory Visit (INDEPENDENT_AMBULATORY_CARE_PROVIDER_SITE_OTHER): Payer: Medicare Other | Admitting: Family Medicine

## 2017-08-24 VITALS — BP 126/60 | HR 67 | Temp 98.5°F | Wt 152.5 lb

## 2017-08-24 DIAGNOSIS — H8110 Benign paroxysmal vertigo, unspecified ear: Secondary | ICD-10-CM | POA: Diagnosis not present

## 2017-08-24 DIAGNOSIS — Z1211 Encounter for screening for malignant neoplasm of colon: Secondary | ICD-10-CM | POA: Diagnosis not present

## 2017-08-24 MED ORDER — BIMATOPROST 0.03 % EX SOLN: CUTANEOUS | 3 refills | Status: DC

## 2017-08-24 NOTE — Progress Notes (Signed)
She has intentional weight loss.  D/w pt.    She had been on latisse prev w/o ADE on med.  Off med currently, needed refill.  rx done.    Celebrex helped more for her aches than naproxen.  She isn't on other nsaids.    D/w pt about taper of HRT.  She was able to get off estrogen but had sig insomnia when off med.  D/w pt about trying 0.5mg  to see if she can tolerate that.  She agrees.    She is due for colonoscopy in the fall, d/w pt.    Vertigo over the last 4-6 weeks, episodically.  She is still on baseline meds.  Sx seemed to wax and wane.  Not constant.  She has some sx occ with looking upward, but not every time.  When laying down, she can have sx.  She isn't consistently lightheaded on standing.  She can have trouble with rolling over in the bed- these are the worse episodes.  She doesn't typically have h/o vertigo over the years.  She can exercise w/o troubles.  No syncope, no presyncope.  She can get off balance.  She doesn't recall if the spin is always in the same direction.  Episodes can be seconds to minutes.  No new hearing loss.  No fevers, chills, she doesn't feel unwell.    Meds, vitals, and allergies reviewed.   ROS: Per HPI unless specifically indicated in ROS section   GEN: nad, alert and oriented HEENT: mucous membranes moist NECK: supple w/o LA CV: rrr.  PULM: ctab, no inc wob ABD: soft, +bs EXT: no edema CN 2-12 wnl B, S/S/DTR wnl x4 DHP positive.

## 2017-08-24 NOTE — Patient Instructions (Addendum)
Try gradually tapering estradiol to the lowest possible effective dose.   Stacie Huff will call about your referral. Likely [facility].  Use the bedside exercises and update me if not better.  Take care.  Glad to see you.

## 2017-08-25 DIAGNOSIS — H8110 Benign paroxysmal vertigo, unspecified ear: Secondary | ICD-10-CM | POA: Insufficient documentation

## 2017-08-25 NOTE — Assessment & Plan Note (Signed)
DHP positive.  Anatomy and path/phys d/w pt.  Would try home bedside exercises and update me as needed.  She agrees.  >25 minutes spent in face to face time with patient, >50% spent in counselling or coordination of care. See avs.

## 2017-09-27 DIAGNOSIS — Z1231 Encounter for screening mammogram for malignant neoplasm of breast: Secondary | ICD-10-CM | POA: Diagnosis not present

## 2017-09-27 LAB — HM MAMMOGRAPHY

## 2017-10-15 ENCOUNTER — Ambulatory Visit: Payer: Medicare Other

## 2017-10-18 ENCOUNTER — Ambulatory Visit: Payer: Medicare Other

## 2017-10-25 ENCOUNTER — Encounter: Payer: Medicare Other | Admitting: Family Medicine

## 2017-12-08 ENCOUNTER — Ambulatory Visit (INDEPENDENT_AMBULATORY_CARE_PROVIDER_SITE_OTHER): Payer: Medicare Other

## 2017-12-08 VITALS — BP 122/70 | HR 61 | Temp 98.5°F | Ht 63.5 in | Wt 160.8 lb

## 2017-12-08 DIAGNOSIS — Z23 Encounter for immunization: Secondary | ICD-10-CM

## 2017-12-08 DIAGNOSIS — Z0000 Encounter for general adult medical examination without abnormal findings: Secondary | ICD-10-CM | POA: Diagnosis not present

## 2017-12-08 DIAGNOSIS — E785 Hyperlipidemia, unspecified: Secondary | ICD-10-CM

## 2017-12-08 LAB — COMPREHENSIVE METABOLIC PANEL
ALT: 13 U/L (ref 0–35)
AST: 18 U/L (ref 0–37)
Albumin: 3.8 g/dL (ref 3.5–5.2)
Alkaline Phosphatase: 63 U/L (ref 39–117)
BILIRUBIN TOTAL: 1 mg/dL (ref 0.2–1.2)
BUN: 18 mg/dL (ref 6–23)
CALCIUM: 9.2 mg/dL (ref 8.4–10.5)
CO2: 29 meq/L (ref 19–32)
CREATININE: 0.64 mg/dL (ref 0.40–1.20)
Chloride: 104 mEq/L (ref 96–112)
GFR: 97.77 mL/min (ref >60.00)
GLUCOSE: 87 mg/dL (ref 70–99)
Potassium: 3.6 mEq/L (ref 3.5–5.1)
SODIUM: 140 meq/L (ref 135–145)
Total Protein: 6.5 g/dL (ref 6.0–8.3)

## 2017-12-08 NOTE — Progress Notes (Signed)
Subjective:   Stacie Huff is a 67 y.o. female who presents for Medicare Annual (Subsequent) preventive examination.  Review of Systems:  N/A Cardiac Risk Factors include: advanced age (>53men, >16 women)     Objective:     Vitals: BP 122/70 (BP Location: Right Arm, Patient Position: Sitting, Cuff Size: Normal)   Pulse 61   Temp 98.5 F (36.9 C) (Oral)   Ht 5' 3.5" (1.613 m) Comment: no shoes  Wt 160 lb 12 oz (72.9 kg)   SpO2 97%   BMI 28.03 kg/m   Body mass index is 28.03 kg/m.  Advanced Directives 12/08/2017 10/14/2016  Does Patient Have a Medical Advance Directive? No No  Would patient like information on creating a medical advance directive? 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 Arthritis   . History of postmenopausal HRT   . Urine, incontinence, stress female    Past Surgical History:  Procedure Laterality Date  . APPENDECTOMY  1960  . BLADDER REPAIR  1999  . BLADDER REPAIR  2004  . BREAST ENHANCEMENT SURGERY  1982  . CHOLECYSTECTOMY  1991  . LAMINECTOMY  1990, 2000 x 2  . PARTIAL HYSTERECTOMY  1982  . TONSILLECTOMY AND ADENOIDECTOMY  1956   Family History  Problem Relation Age of Onset  . Sarcoidosis Mother   . Colon cancer Father        at age 60  . Breast cancer Neg  Hx    Social History   Socioeconomic History  . Marital status: Married    Spouse name: Not on file  . Number of children: Not on file  . Years of education: Not on file  . Highest education level: Not on file  Occupational History  . Not on file  Social Needs  . Financial resource strain: Not on file  . Food insecurity:    Worry: Not on file    Inability: Not on file  . Transportation needs:    Medical: Not on file    Non-medical: Not on file  Tobacco Use  . Smoking status: Never Smoker  . Smokeless tobacco: Never Used  Substance and Sexual Activity  . Alcohol use: No    Alcohol/week: 0.0 oz  . Drug use: No  . Sexual activity: Not Currently  Lifestyle  . Physical activity:    Days per week: Not on file    Minutes per session: Not on file  . Stress: Not on file  Relationships  . Social connections:    Talks on phone: Not on file    Gets together: Not on file    Attends religious service: Not on file    Active member of club or organization: Not on file    Attends meetings of clubs or  organizations: Not on file    Relationship status: Not on file  Other Topics Concern  . Not on file  Social History Narrative   Married 2015   Retired Hospital doctor   Enjoys water skiing.      Outpatient Encounter Medications as of 12/08/2017  Medication Sig  . Calcium Carb-Cholecalciferol (CALCIUM 600/VITAMIN D3 PO) Take by mouth daily.  . celecoxib (CELEBREX) 200 MG capsule Take 200 mg by mouth daily.  Marland Kitchen estradiol (ESTRACE) 1 MG tablet Take 1 tablet (1 mg total) by mouth daily.  . hydrochlorothiazide (HYDRODIURIL) 25 MG tablet Take 1 tablet (25 mg total) by mouth daily.  Marland Kitchen OVER THE COUNTER MEDICATION daily. Fish oil 1200, omega 3- 600 mg, with vitamin d 3- 2000 units  . potassium chloride (K-DUR) 10 MEQ tablet Take 1 tablet (10 mEq total) by mouth daily.  . bimatoprost (LATISSE) 0.03 % ophthalmic solution Place one drop on applicator and apply evenly along the skin of the upper eyelid at  base of eyelashes once daily at bedtime; repeat procedure for second eye (use a clean applicator). (Patient not taking: Reported on 12/08/2017)   No facility-administered encounter medications on file as of 12/08/2017.     Activities of Daily Living In your present state of health, do you have any difficulty performing the following activities: 12/08/2017  Hearing? N  Vision? N  Difficulty concentrating or making decisions? N  Walking or climbing stairs? N  Dressing or bathing? N  Doing errands, shopping? N  Preparing Food and eating ? N  Using the Toilet? N  In the past six months, have you accidently leaked urine? N  Do you have problems with loss of bowel control? N  Managing your Medications? N  Managing your Finances? N  Housekeeping or managing your Housekeeping? N  Some recent data might be hidden    Patient Care Team: Tonia Ghent, MD as PCP - General (Family Medicine)    Assessment:   This is a routine wellness examination for Stacie Huff.   Hearing Screening   125Hz  250Hz  500Hz  1000Hz  2000Hz  3000Hz  4000Hz  6000Hz  8000Hz   Right ear:   40 40 40  40    Left ear:   40 40 40  40    Vision Screening Comments: May 2018 with Dr. Gertha Calkin; future appt scheduled 12/23/2017   Exercise Activities and Dietary recommendations Current Exercise Habits: Home exercise routine, Type of exercise: walking;Other - see comments(skiiing, swimming), Time (Minutes): 60(60-90 minutes), Frequency (Times/Week): 3, Weekly Exercise (Minutes/Week): 180, Intensity: Moderate, Exercise limited by: None identified  Goals    . Increase physical activity     Starting 12/08/2017, I will attempt to exercise for at least 150 minutes weekly.        Fall Risk Fall Risk  12/08/2017 10/21/2016 10/14/2016  Falls in the past year? No No No   Depression Screen PHQ 2/9 Scores 12/08/2017 10/21/2016 10/14/2016  PHQ - 2 Score 0 0 0  PHQ- 9 Score 0 - -     Cognitive Function MMSE - Mini Mental State Exam  12/08/2017 10/14/2016  Orientation to time 5 5  Orientation to Place 5 5  Registration 3 3  Attention/ Calculation 0 0  Recall 3 3  Language- name 2 objects 0 0  Language- repeat 1 1  Language- follow 3 step command 3 3  Language- read & follow direction 0 0  Write a sentence 0 0  Copy design 0 0  Total score 20 20  PLEASE NOTE: A Mini-Cog screen was completed. Maximum score is 20. A value of 0 denotes this part of Folstein MMSE was not completed or the patient failed this part of the Mini-Cog screening.   Mini-Cog Screening Orientation to Time - Max 5 pts Orientation to Place - Max 5 pts Registration - Max 3 pts Recall - Max 3 pts Language Repeat - Max 1 pts Language Follow 3 Step Command - Max 3 pts   Immunization History  Administered Date(s) Administered  . Influenza-Unspecified 04/12/2016, 04/05/2017  . Meningococcal Polysaccharide 11/11/2012  . Pneumococcal Conjugate-13 10/14/2016  . Pneumococcal Polysaccharide-23 12/08/2017  . Tdap 10/14/2016  . Yellow Fever 11/11/2012   Screening Tests Health Maintenance  Topic Date Due  . COLONOSCOPY  04/11/2018 (Originally 07/13/2017)  . INFLUENZA VACCINE  02/10/2018  . MAMMOGRAM  09/25/2018  . TETANUS/TDAP  10/15/2026  . DEXA SCAN  Completed  . Hepatitis C Screening  Completed  . PNA vac Low Risk Adult  Completed      Plan:     I have personally reviewed, addressed, and noted the following in the patient's chart:  A. Medical and social history B. Use of alcohol, tobacco or illicit drugs  C. Current medications and supplements D. Functional ability and status E.  Nutritional status F.  Physical activity G. Advance directives H. List of other physicians I.  Hospitalizations, surgeries, and ER visits in previous 12 months J.  [place] to include hearing, vision, cognitive, depression L. Referrals and appointments - none  In addition, I have reviewed and discussed with patient certain preventive  protocols, quality metrics, and best practice recommendations. A written personalized care plan for preventive services as well as general preventive health recommendations were provided to patient.  See attached scanned questionnaire for additional information.   Signed,   Lindell Noe, MHA, BS, LPN Health Coach

## 2017-12-08 NOTE — Progress Notes (Signed)
PCP notes:   Health maintenance:  Colonoscopy - addressed PPSV23 - administered  Abnormal screenings:   None  Patient concerns:   None  Nurse concerns:  None  Next PCP appt:   12/14/17 @ 0930

## 2017-12-08 NOTE — Patient Instructions (Addendum)
Ms. Bartell , Thank you for taking time to come for your Medicare Wellness Visit. I appreciate your ongoing commitment to your health goals. Please review the following plan we discussed and let me know if I can assist you in the future.   These are the goals we discussed: Goals    . Increase physical activity     Starting 12/08/2017, I will attempt to exercise for at least 150 minutes weekly.        This is a list of the screening recommended for you and due dates:  Health Maintenance  Topic Date Due  . Colon Cancer Screening  04/11/2018*  . Flu Shot  02/10/2018  . Mammogram  09/25/2018  . Tetanus Vaccine  10/15/2026  . DEXA scan (bone density measurement)  Completed  .  Hepatitis C: One time screening is recommended by Center for Disease Control  (CDC) for  adults born from [birth year] through 1967.   Completed  . Pneumonia vaccines  Completed  *Topic was postponed. The date shown is not the original due date.   Preventive Care for Adults  A healthy lifestyle and preventive care can promote health and wellness. Preventive health guidelines for adults include the following key practices.  . A routine yearly physical is a good way to check with your health care provider about your health and preventive screening. It is a chance to share any concerns and updates on your health and to receive a thorough exam.  . Visit your dentist for a routine exam and preventive care every 6 months. Brush your teeth twice a day and floss once a day. Good oral hygiene prevents tooth decay and gum disease.  . The frequency of eye exams is based on your age, health, family medical history, use  of contact lenses, and other factors. Follow your health care provider's recommendations for frequency of eye exams.  . Eat a healthy diet. Foods like vegetables, fruits, whole grains, low-fat dairy products, and lean protein foods contain the nutrients you need without too many calories. Decrease your intake of foods  high in solid fats, added sugars, and salt. Eat the right amount of calories for you. Get information about a proper diet from your health care provider, if necessary.  . Regular physical exercise is one of the most important things you can do for your health. Most adults should get at least 150 minutes of moderate-intensity exercise (any activity that increases your heart rate and causes you to sweat) each week. In addition, most adults need muscle-strengthening exercises on 2 or more days a week.  Silver Sneakers may be a benefit available to you. To determine eligibility, you may visit the website: www.silversneakers.com or contact program at 316-037-6602 Mon-Fri between 8AM-8PM.   . Maintain a healthy weight. The body mass index (BMI) is a screening tool to identify possible weight problems. It provides an estimate of body fat based on height and weight. Your health care provider can find your BMI and can help you achieve or maintain a healthy weight.   For adults 20 years and older: ? A BMI below 18.5 is considered underweight. ? A BMI of 18.5 to 24.9 is normal. ? A BMI of 25 to 29.9 is considered overweight. ? A BMI of 30 and above is considered obese.   . Maintain normal blood lipids and cholesterol levels by exercising and minimizing your intake of saturated fat. Eat a balanced diet with plenty of fruit and vegetables. Blood tests for lipids and  cholesterol should begin at age 61 and be repeated every 5 years. If your lipid or cholesterol levels are high, you are over 50, or you are at high risk for heart disease, you may need your cholesterol levels checked more frequently. Ongoing high lipid and cholesterol levels should be treated with medicines if diet and exercise are not working.  . If you smoke, find out from your health care provider how to quit. If you do not use tobacco, please do not start.  . If you choose to drink alcohol, please do not consume more than 2 drinks per day.  One drink is considered to be 12 ounces (355 mL) of beer, 5 ounces (148 mL) of wine, or 1.5 ounces (44 mL) of liquor.  . If you are 39-22 years old, ask your health care provider if you should take aspirin to prevent strokes.  . Use sunscreen. Apply sunscreen liberally and repeatedly throughout the day. You should seek shade when your shadow is shorter than you. Protect yourself by wearing long sleeves, pants, a wide-brimmed hat, and sunglasses year round, whenever you are outdoors.  . Once a month, do a whole body skin exam, using a mirror to look at the skin on your back. Tell your health care provider of new moles, moles that have irregular borders, moles that are larger than a pencil eraser, or moles that have changed in shape or color.

## 2017-12-09 NOTE — Progress Notes (Signed)
I reviewed health advisor's note, was available for consultation, and agree with documentation and plan.  

## 2017-12-14 ENCOUNTER — Ambulatory Visit (INDEPENDENT_AMBULATORY_CARE_PROVIDER_SITE_OTHER): Payer: Medicare Other | Admitting: Family Medicine

## 2017-12-14 ENCOUNTER — Encounter: Payer: Self-pay | Admitting: Family Medicine

## 2017-12-14 VITALS — BP 122/70 | HR 61 | Temp 98.5°F | Ht 63.5 in | Wt 160.8 lb

## 2017-12-14 DIAGNOSIS — G8929 Other chronic pain: Secondary | ICD-10-CM

## 2017-12-14 DIAGNOSIS — R609 Edema, unspecified: Secondary | ICD-10-CM | POA: Diagnosis not present

## 2017-12-14 DIAGNOSIS — M549 Dorsalgia, unspecified: Secondary | ICD-10-CM | POA: Diagnosis not present

## 2017-12-14 DIAGNOSIS — Z7189 Other specified counseling: Secondary | ICD-10-CM

## 2017-12-14 DIAGNOSIS — Z79890 Hormone replacement therapy (postmenopausal): Secondary | ICD-10-CM

## 2017-12-14 DIAGNOSIS — Z0000 Encounter for general adult medical examination without abnormal findings: Secondary | ICD-10-CM

## 2017-12-14 MED ORDER — ACETAMINOPHEN 500 MG PO TABS: 500.0000 mg | ORAL_TABLET | Freq: Two times a day (BID) | ORAL | Status: DC | PRN

## 2017-12-14 MED ORDER — CELECOXIB 100 MG PO CAPS: 100.0000 mg | ORAL_CAPSULE | Freq: Two times a day (BID) | ORAL | 3 refills | Status: DC

## 2017-12-14 MED ORDER — BIMATOPROST 0.03 % EX SOLN: CUTANEOUS | 3 refills | Status: DC

## 2017-12-14 MED ORDER — POTASSIUM CHLORIDE ER 10 MEQ PO TBCR: 10.0000 meq | EXTENDED_RELEASE_TABLET | Freq: Every day | ORAL | 3 refills | Status: DC

## 2017-12-14 MED ORDER — HYDROCHLOROTHIAZIDE 25 MG PO TABS: 25.0000 mg | ORAL_TABLET | Freq: Every day | ORAL | 3 refills | Status: DC

## 2017-12-14 MED ORDER — ESTRADIOL 1 MG PO TABS: 0.5000 mg | ORAL_TABLET | Freq: Every day | ORAL | 3 refills | Status: DC

## 2017-12-14 MED ORDER — METHOCARBAMOL 500 MG PO TABS: 500.0000 mg | ORAL_TABLET | Freq: Every evening | ORAL | 3 refills | Status: DC | PRN

## 2017-12-14 NOTE — Patient Instructions (Signed)
Try the celebrex 100mg  twice a day with tylenol as needed.  Update me as needed.  Take care.  Glad to see you.

## 2017-12-14 NOTE — Progress Notes (Signed)
H/o edema.  On HCTZ prn.  Taking K with it at baseline.  Labs d/w pt.  No ADE on med.   She is taking celebrex daily.  She has chronic back pain.  She is taking tylenol at night and that helps some.  She is more stiff after getting up from sitting.  We talked about AM/PM tylenol and changing celebrex to 100mg  BID.  She has used robaxin prn in the past when she had spasms, sedation caution d/w pt.    She is cutting back on estradiol. Down to about 5.5mg  per week, down from 7mg  per week with 1mg  per day prev.  She didn't tolerate a taper down to 0.5mg  daily.   Flu vaccine - done yearly.   Tetanus done 2018 at pharmacy 10/14/16 PNA vaccine - she had local reaction.  D/w pt.   Shingles d/w pt.  Encouraged.  Out of stock.   Colonoscopy 2009 Mammogram and DXA up to date.  Mammogram done 09/2017 per patient report.   D/w pt. If patient incapacitated, then would have her husband designated.  Hep C screening - completed prev.    Meds, vitals, and allergies reviewed.   PMH and SH reviewed  ROS: Per HPI unless specifically indicated in ROS section   GEN: nad, alert and oriented HEENT: mucous membranes moist NECK: supple w/o LA CV: rrr. PULM: ctab, no inc wob ABD: soft, +bs EXT: no edema SKIN: no acute rash, she does have a varicose vein in the R lower leg noted.

## 2017-12-15 ENCOUNTER — Telehealth: Payer: Self-pay | Admitting: Family Medicine

## 2017-12-15 NOTE — Telephone Encounter (Signed)
2 issues.  I do not have a copy of her mammogram.  It was done [facility] in March of this year.  Please see if you can get a copy of that.  The other issue was about colon cancer screening.  Please contact the patient.  I wanted to make sure I was crystal-clear about her follow-up plans for colonoscopy.  Please let me know if she needs referral.  Thanks.

## 2017-12-15 NOTE — Assessment & Plan Note (Signed)
Flu vaccine - done yearly.   Tetanus done 2018 at pharmacy 10/14/16 PNA vaccine - she had local reaction.  D/w pt.   Shingles d/w pt.  Encouraged.  Out of stock.   Colonoscopy 2009 Mammogram and DXA up to date.  Mammogram done 09/2017 per patient report.   D/w pt. If patient incapacitated, then would have her husband designated.  Hep C screening - completed prev.

## 2017-12-15 NOTE — Assessment & Plan Note (Signed)
She is slowly tapering down.  She cuts out an extra half tab occasionally.  She will gradually try to work down in a reasonable fashion.  She can update me as needed.

## 2017-12-15 NOTE — Assessment & Plan Note (Signed)
If patient incapacitated, then would have her husband designated.  

## 2017-12-15 NOTE — Assessment & Plan Note (Signed)
Continue as needed hydrochlorothiazide.  She takes potassium when she takes the hydrochlorothiazide.  Update me as needed.  Labs discussed with patient.

## 2017-12-15 NOTE — Assessment & Plan Note (Signed)
Reasonable to try splitting the Celebrex into separate doses, 100 mg twice a day.  She can use Robaxin when she has spasms.  Likely reasonable to add on Tylenol in the morning and in the evening.  Continue work on routine exercise.  Update me as needed.  She agrees. >25 minutes spent in face to face time with patient, >50% spent in counselling or coordination of care, discussing back pain, HRT, etc.

## 2017-12-15 NOTE — Telephone Encounter (Signed)
Called and got a copy of the mammogram report and it has been placed on your desk.  Patient notified as instructed by telephone and verbalized understanding. Patient stated that if she does not hear from her GI doctor by the first week of July she will call them and set up an appointment with to have colonoscopy done in September when it is due.

## 2017-12-16 NOTE — Telephone Encounter (Addendum)
Thanks for clarifying all of that.  I appreciate it.

## 2017-12-21 ENCOUNTER — Encounter: Payer: Self-pay | Admitting: Family Medicine

## 2017-12-23 DIAGNOSIS — H35033 Hypertensive retinopathy, bilateral: Secondary | ICD-10-CM | POA: Diagnosis not present

## 2018-01-11 ENCOUNTER — Encounter: Payer: Self-pay | Admitting: Gastroenterology

## 2018-02-07 ENCOUNTER — Ambulatory Visit (AMBULATORY_SURGERY_CENTER): Payer: Self-pay | Admitting: *Deleted

## 2018-02-07 VITALS — Ht 64.0 in | Wt 165.0 lb

## 2018-02-07 DIAGNOSIS — Z1211 Encounter for screening for malignant neoplasm of colon: Secondary | ICD-10-CM

## 2018-02-07 MED ORDER — NA SULFATE-K SULFATE-MG SULF 17.5-3.13-1.6 GM/177ML PO SOLN: 1.0000 | Freq: Once | ORAL | 0 refills | Status: AC

## 2018-02-07 MED ORDER — NA SULFATE-K SULFATE-MG SULF 17.5-3.13-1.6 GM/177ML PO SOLN: 1.0000 | Freq: Once | ORAL | 0 refills | Status: DC

## 2018-02-07 NOTE — Progress Notes (Signed)
Patient denies any allergies to eggs or soy. Patient denies any problems with anesthesia/sedation. Patient denies any oxygen use at home. Patient denies taking any diet/weight loss medications or blood thinners. EMMI education offered, pt declined.  

## 2018-02-22 ENCOUNTER — Ambulatory Visit (AMBULATORY_SURGERY_CENTER): Payer: Medicare Other | Admitting: Gastroenterology

## 2018-02-22 ENCOUNTER — Encounter: Payer: Self-pay | Admitting: Gastroenterology

## 2018-02-22 VITALS — BP 131/72 | HR 64 | Temp 98.6°F | Resp 14 | Ht 64.0 in | Wt 165.0 lb

## 2018-02-22 DIAGNOSIS — K5730 Diverticulosis of large intestine without perforation or abscess without bleeding: Secondary | ICD-10-CM

## 2018-02-22 DIAGNOSIS — Z1211 Encounter for screening for malignant neoplasm of colon: Secondary | ICD-10-CM | POA: Diagnosis not present

## 2018-02-22 DIAGNOSIS — K635 Polyp of colon: Secondary | ICD-10-CM

## 2018-02-22 DIAGNOSIS — D125 Benign neoplasm of sigmoid colon: Secondary | ICD-10-CM

## 2018-02-22 MED ORDER — SODIUM CHLORIDE 0.9 % IV SOLN: 500.0000 mL | Freq: Once | INTRAVENOUS | Status: DC

## 2018-02-22 NOTE — Progress Notes (Signed)
Called to room to assist during endoscopic procedure.  Patient ID and intended procedure confirmed with present staff. Received instructions for my participation in the procedure from the performing physician.  

## 2018-02-22 NOTE — Op Note (Signed)
[facility] Patient Name: Stacie Huff Procedure Date: 02/22/2018 10:24 AM MRN: [id number] Endoscopist: Gerrit Heck , MD Age: 67 Referring MD:  Date of Birth: 17-Feb-1951 Gender: Female Account #: [id number] Procedure:                Colonoscopy Indications:              Screening in patient at increased risk: Colorectal                            cancer in father in his 38's. She is otherwise                            without any active GI symptoms. Last colonoscopy                            was in 2009. Medicines:                Monitored Anesthesia Care Procedure:                Pre-Anesthesia Assessment:                           - Prior to the procedure, a History and Physical                            was performed, and patient medications and                            allergies were reviewed. The patient's tolerance of                            previous anesthesia was also reviewed. The risks                            and benefits of the procedure and the sedation                            options and risks were discussed with the patient.                            All questions were answered, and informed consent                            was obtained. Prior Anticoagulants: The patient has                            taken no previous anticoagulant or antiplatelet                            agents. ASA Grade Assessment: II - A patient with                            mild systemic disease. After reviewing the risks  and benefits, the patient was deemed in                            satisfactory condition to undergo the procedure.                           After obtaining informed consent, the colonoscope                            was passed under direct vision. Throughout the                            procedure, the patient's blood pressure, pulse, and                            oxygen saturations were monitored continuously. The                            Colonoscope was introduced through the anus and                            advanced to the the terminal ileum. The colonoscopy                            was performed without difficulty. The patient                            tolerated the procedure well. The quality of the                            bowel preparation was good. Scope In: 10:31:36 AM Scope Out: 10:48:42 AM Scope Withdrawal Time: 0 hours 11 minutes 48 seconds  Total Procedure Duration: 0 hours 17 minutes 6 seconds  Findings:                 The perianal and digital rectal examinations were                            normal.                           A 2 mm polyp was found in the sigmoid colon. The                            polyp was sessile. The polyp was removed with a                            cold biopsy forceps. Resection and retrieval were                            complete. Estimated blood loss was minimal.                           A few small-mouthed diverticula were found in the  sigmoid colon.                           The terminal ileum appeared normal.                           The retroflexed view of the distal rectum and anal                            verge was normal and showed no anal or rectal                            abnormalities. Complications:            No immediate complications. Estimated Blood Loss:     Estimated blood loss was minimal. Impression:               - One 2 mm polyp in the sigmoid colon, removed with                            a cold biopsy forceps. Resected and retrieved.                           - Diverticulosis in the sigmoid colon.                           - The examined portion of the ileum was normal.                           - The distal rectum and anal verge are normal on                            retroflexion view. Recommendation:           - Patient has a contact number available for                             emergencies. The signs and symptoms of potential                            delayed complications were discussed with the                            patient. Return to normal activities tomorrow.                            Written discharge instructions were provided to the                            patient.                           - Resume previous diet today.                           - Continue present medications.                           -  Await pathology results.                           - Repeat colonoscopy in 5-10 years for surveillance                            based on pathology results.                           - Return to GI clinic PRN. Gerrit Heck, MD 02/22/2018 10:54:34 AM

## 2018-02-22 NOTE — Progress Notes (Signed)
Spontaneous respirations throughout. VSS. Resting comfortably. To PACU on room air. Report to  RN. 

## 2018-02-22 NOTE — Patient Instructions (Signed)
**   Handouts given on polyps and diverticulosis **   YOU HAD AN ENDOSCOPIC PROCEDURE TODAY AT THE [facility]:   Refer to the procedure report that was given to you for any specific questions about what was found during the examination.  If the procedure report does not answer your questions, please call your gastroenterologist to clarify.  If you requested that your care partner not be given the details of your procedure findings, then the procedure report has been included in a sealed envelope for you to review at your convenience later.  YOU SHOULD EXPECT: Some feelings of bloating in the abdomen. Passage of more gas than usual.  Walking can help get rid of the air that was put into your GI tract during the procedure and reduce the bloating. If you had a lower endoscopy (such as a colonoscopy or flexible sigmoidoscopy) you may notice spotting of blood in your stool or on the toilet paper. If you underwent a bowel prep for your procedure, you may not have a normal bowel movement for a few days.  Please Note:  You might notice some irritation and congestion in your nose or some drainage.  This is from the oxygen used during your procedure.  There is no need for concern and it should clear up in a day or so.  SYMPTOMS TO REPORT IMMEDIATELY:   Following lower endoscopy (colonoscopy or flexible sigmoidoscopy):  Excessive amounts of blood in the stool  Significant tenderness or worsening of abdominal pains  Swelling of the abdomen that is new, acute  Fever of 100F or higher  For urgent or emergent issues, a gastroenterologist can be reached at any hour by calling [phone].   DIET:  We do recommend a small meal at first, but then you may proceed to your regular diet.  Drink plenty of fluids but you should avoid alcoholic beverages for 24 hours.  ACTIVITY:  You should plan to take it easy for the rest of today and you should NOT DRIVE or use heavy machinery until tomorrow (because  of the sedation medicines used during the test).    FOLLOW UP: Our staff will call the number listed on your records the next business day following your procedure to check on you and address any questions or concerns that you may have regarding the information given to you following your procedure. If we do not reach you, we will leave a message.  However, if you are feeling well and you are not experiencing any problems, there is no need to return our call.  We will assume that you have returned to your regular daily activities without incident.  If any biopsies were taken you will be contacted by phone or by letter within the next 1-3 weeks.  Please call us at [phone] if you have not heard about the biopsies in 3 weeks.    SIGNATURES/CONFIDENTIALITY: You and/or your care partner have signed paperwork which will be entered into your electronic medical record.  These signatures attest to the fact that that the information above on your After Visit Summary has been reviewed and is understood.  Full responsibility of the confidentiality of this discharge information lies with you and/or your care-partner. 

## 2018-02-22 NOTE — Progress Notes (Signed)
Pt's states no medical or surgical changes since previsit or office visit. 

## 2018-02-23 ENCOUNTER — Telehealth: Payer: Self-pay

## 2018-02-23 NOTE — Telephone Encounter (Signed)
  Follow up Call-  Call back number 02/22/2018  Post procedure Call Back phone  # [phone]  Permission to leave phone message Yes  Some recent data might be hidden     Patient questions:  Do you have a fever, pain , or abdominal swelling? No. Pain Score  0 *  Have you tolerated food without any problems? Yes.    Have you been able to return to your normal activities? Yes.    Do you have any questions about your discharge instructions: Diet   No. Medications  No. Follow up visit  No.  Do you have questions or concerns about your Care? No.  Actions: * If pain score is 4 or above: No action needed, pain <4.

## 2018-03-10 ENCOUNTER — Encounter: Payer: Self-pay | Admitting: Gastroenterology

## 2018-04-19 DIAGNOSIS — Z23 Encounter for immunization: Secondary | ICD-10-CM | POA: Diagnosis not present

## 2018-05-12 DIAGNOSIS — L578 Other skin changes due to chronic exposure to nonionizing radiation: Secondary | ICD-10-CM | POA: Diagnosis not present

## 2018-05-12 DIAGNOSIS — L570 Actinic keratosis: Secondary | ICD-10-CM | POA: Diagnosis not present

## 2018-05-12 DIAGNOSIS — L811 Chloasma: Secondary | ICD-10-CM | POA: Diagnosis not present

## 2018-05-12 DIAGNOSIS — L821 Other seborrheic keratosis: Secondary | ICD-10-CM | POA: Diagnosis not present

## 2018-05-12 DIAGNOSIS — D1801 Hemangioma of skin and subcutaneous tissue: Secondary | ICD-10-CM | POA: Diagnosis not present

## 2018-11-19 ENCOUNTER — Other Ambulatory Visit: Payer: Self-pay | Admitting: Family Medicine

## 2018-11-29 DIAGNOSIS — Z1231 Encounter for screening mammogram for malignant neoplasm of breast: Secondary | ICD-10-CM | POA: Diagnosis not present

## 2018-11-29 LAB — HM MAMMOGRAPHY

## 2018-12-07 ENCOUNTER — Telehealth: Payer: Self-pay

## 2018-12-07 NOTE — Telephone Encounter (Signed)
B[place],  Can you please follow up with patient regarding the details of her wellness visits for next week.    Please see her questions/concerns below.

## 2018-12-07 NOTE — Telephone Encounter (Signed)
[place] Night - Client Nonclinical Telephone Record [facility] Night - Client Client Site [facility] - Night Physician Renford Dills - MD Contact Type Call Who Is Calling Patient / Member / Family / Caregiver Caller Name Stacie Huff Caller Phone Number [phone] Call Type Message Only Information Provided Reason for Call Returning a Call from the Office Initial Stacie Huff says that she had a call that she has a wellness appt next week, and needs to know the day. They had called. Not sure if they want to do a virtual appt. Additional Comment Call Closed By: Norton Blizzard Transaction Date/Time: 12/06/2018 5:15:05 PM (ET)

## 2018-12-07 NOTE — Telephone Encounter (Signed)
Called pt and got her scheduled for her medicare wellness next week and she rescheduled her physical for August

## 2018-12-12 ENCOUNTER — Ambulatory Visit (INDEPENDENT_AMBULATORY_CARE_PROVIDER_SITE_OTHER): Payer: Medicare Other

## 2018-12-12 ENCOUNTER — Telehealth: Payer: Self-pay

## 2018-12-12 VITALS — Wt 147.0 lb

## 2018-12-12 DIAGNOSIS — Z0000 Encounter for general adult medical examination without abnormal findings: Secondary | ICD-10-CM | POA: Diagnosis not present

## 2018-12-12 DIAGNOSIS — E2839 Other primary ovarian failure: Secondary | ICD-10-CM

## 2018-12-12 NOTE — Telephone Encounter (Signed)
Patient has requested refills for medications that are managed by PCP be sent to [facility].   Patient states she wants a refill for Robaxin but does not want medication filled at this time.

## 2018-12-12 NOTE — Patient Instructions (Signed)
Stacie Huff , Thank you for taking time to come for your Medicare Wellness Visit. I appreciate your ongoing commitment to your health goals. Please review the following plan we discussed and let me know if I can assist you in the future.   These are the goals we discussed: Goals    . Increase physical activity     Starting 12/12/2018, I will continue to exercise for 60-100 minutes daily.        This is a list of the screening recommended for you and due dates:  Health Maintenance  Topic Date Due  . Flu Shot  02/11/2019  . Mammogram  11/25/2019  . Tetanus Vaccine  10/15/2026  . Colon Cancer Screening  02/23/2028  . DEXA scan (bone density measurement)  Completed  .  Hepatitis C: One time screening is recommended by Center for Disease Control  (CDC) for  adults born from [birth year] through 1967.   Completed  . Pneumonia vaccines  Completed   Preventive Care for Adults  A healthy lifestyle and preventive care can promote health and wellness. Preventive health guidelines for adults include the following key practices.  . A routine yearly physical is a good way to check with your health care provider about your health and preventive screening. It is a chance to share any concerns and updates on your health and to receive a thorough exam.  . Visit your dentist for a routine exam and preventive care every 6 months. Brush your teeth twice a day and floss once a day. Good oral hygiene prevents tooth decay and gum disease.  . The frequency of eye exams is based on your age, health, family medical history, use  of contact lenses, and other factors. Follow your health care provider's recommendations for frequency of eye exams.  . Eat a healthy diet. Foods like vegetables, fruits, whole grains, low-fat dairy products, and lean protein foods contain the nutrients you need without too many calories. Decrease your intake of foods high in solid fats, added sugars, and salt. Eat the right amount of calories  for you. Get information about a proper diet from your health care provider, if necessary.  . Regular physical exercise is one of the most important things you can do for your health. Most adults should get at least 150 minutes of moderate-intensity exercise (any activity that increases your heart rate and causes you to sweat) each week. In addition, most adults need muscle-strengthening exercises on 2 or more days a week.  Silver Sneakers may be a benefit available to you. To determine eligibility, you may visit the website: www.silversneakers.com or contact program at (513)764-5802 Mon-Fri between 8AM-8PM.   . Maintain a healthy weight. The body mass index (BMI) is a screening tool to identify possible weight problems. It provides an estimate of body fat based on height and weight. Your health care provider can find your BMI and can help you achieve or maintain a healthy weight.   For adults 20 years and older: ? A BMI below 18.5 is considered underweight. ? A BMI of 18.5 to 24.9 is normal. ? A BMI of 25 to 29.9 is considered overweight. ? A BMI of 30 and above is considered obese.   . Maintain normal blood lipids and cholesterol levels by exercising and minimizing your intake of saturated fat. Eat a balanced diet with plenty of fruit and vegetables. Blood tests for lipids and cholesterol should begin at age 17 and be repeated every 5 years. If your lipid  or cholesterol levels are high, you are over 50, or you are at high risk for heart disease, you may need your cholesterol levels checked more frequently. Ongoing high lipid and cholesterol levels should be treated with medicines if diet and exercise are not working.  . If you smoke, find out from your health care provider how to quit. If you do not use tobacco, please do not start.  . If you choose to drink alcohol, please do not consume more than 2 drinks per day. One drink is considered to be 12 ounces (355 mL) of beer, 5 ounces (148 mL) of  wine, or 1.5 ounces (44 mL) of liquor.  . If you are 9-71 years old, ask your health care provider if you should take aspirin to prevent strokes.  . Use sunscreen. Apply sunscreen liberally and repeatedly throughout the day. You should seek shade when your shadow is shorter than you. Protect yourself by wearing long sleeves, pants, a wide-brimmed hat, and sunglasses year round, whenever you are outdoors.  . Once a month, do a whole body skin exam, using a mirror to look at the skin on your back. Tell your health care provider of new moles, moles that have irregular borders, moles that are larger than a pencil eraser, or moles that have changed in shape or color.

## 2018-12-12 NOTE — Progress Notes (Signed)
Subjective:   Stacie Huff is a 68 y.o. female who presents for Medicare Annual (Subsequent) preventive examination.  Review of Systems:  N/A Cardiac Risk Factors include: advanced age (>63men, >50 women)     Objective:     Vitals: Wt 147 lb (66.7 kg) Comment: patient supplied  BMI 25.23 kg/m   Body mass index is 25.23 kg/m.  Advanced Directives 12/12/2018 12/08/2017 10/14/2016  Does Patient Have a Medical Advance Directive? Yes No No  Type of Paramedic of [place];Living will - -  Copy of [place] in Chart? No - copy requested - -  Would patient like information on creating a medical advance directive? - No - Patient declined -    Tobacco Social History   Tobacco Use  Smoking Status Never Smoker  Smokeless Tobacco Never Used     Counseling given: No   Clinical Intake:  Pre-visit preparation completed: Yes  Pain : No/denies pain Pain Score: 0-No pain     Nutritional Status: BMI 25 -29 Overweight Nutritional Risks: None Diabetes: No  How often do you need to have someone help you when you read instructions, pamphlets, or other written materials from your doctor or pharmacy?: 1 - Never What is the last grade level you completed in school?: Bachelor degree  Interpreter Needed?: No  Comments: pt lives with spouse Information entered by :: LPinson, LPN  Past Medical History:  Diagnosis Date  . Arthritis   . History of postmenopausal HRT   . Urine, incontinence, stress female    Past Surgical History:  Procedure Laterality Date  . APPENDECTOMY  1960  . BLADDER REPAIR  1999  . BLADDER REPAIR  2004  . BREAST ENHANCEMENT SURGERY  1982  . CHOLECYSTECTOMY  1991  . LAMINECTOMY  3 total.  1990 x1, 2000 x2  . PARTIAL HYSTERECTOMY  1982  . TONSILLECTOMY AND ADENOIDECTOMY  1956   Family History  Problem Relation Age of Onset  . Sarcoidosis Mother   . Colon cancer Father        at age 28  . Breast cancer Neg Hx   .  Esophageal cancer Neg Hx   . Rectal cancer Neg Hx   . Stomach cancer Neg Hx    Social History   Socioeconomic History  . Marital status: Married    Spouse name: Not on file  . Number of children: Not on file  . Years of education: Not on file  . Highest education level: Not on file  Occupational History  . Not on file  Social Needs  . Financial resource strain: Not on file  . Food insecurity:    Worry: Not on file    Inability: Not on file  . Transportation needs:    Medical: Not on file    Non-medical: Not on file  Tobacco Use  . Smoking status: Never Smoker  . Smokeless tobacco: Never Used  Substance and Sexual Activity  . Alcohol use: No    Alcohol/week: 0.0 standard drinks  . Drug use: No  . Sexual activity: Not Currently  Lifestyle  . Physical activity:    Days per week: Not on file    Minutes per session: Not on file  . Stress: Not on file  Relationships  . Social connections:    Talks on phone: Not on file    Gets together: Not on file    Attends religious service: Not on file    Active member of club or  organization: Not on file    Attends meetings of clubs or organizations: Not on file    Relationship status: Not on file  Other Topics Concern  . Not on file  Social History Narrative   Married 2015   Retired Hospital doctor   Enjoys water skiing.      Outpatient Encounter Medications as of 12/12/2018  Medication Sig  . acetaminophen (TYLENOL) 500 MG tablet Take 1-2 tablets (500-1,000 mg total) by mouth 2 (two) times daily as needed. (Patient taking differently: Take 500-1,000 mg by mouth 2 (two) times daily as needed. Tylenol Arthritis 2 capsules at bedtime)  . Calcium Carb-Cholecalciferol (CALCIUM 600/VITAMIN D3 PO) Take by mouth daily.  . celecoxib (CELEBREX) 100 MG capsule Take 1 capsule (100 mg total) by mouth 2 (two) times daily. (Patient taking differently: Take 100 mg by mouth daily. )  . estradiol (ESTRACE) 1 MG tablet Take 1 tablet (1 mg total) by mouth  daily. (Patient taking differently: Take 0.5 mg by mouth daily. )  . hydrochlorothiazide (HYDRODIURIL) 25 MG tablet TAKE 1 TABLET BY MOUTH  DAILY  . OVER THE COUNTER MEDICATION daily. Fish oil 1200, omega 3- 600 mg, with vitamin d 3- 2000 units  . potassium chloride (K-DUR) 10 MEQ tablet TAKE 1 TABLET BY MOUTH  DAILY  . [DISCONTINUED] potassium chloride (K-DUR) 10 MEQ tablet Take 1 tablet (10 mEq total) by mouth daily.  . bimatoprost (LATISSE) 0.03 % ophthalmic solution Place one drop on applicator and apply evenly along the skin of the upper eyelid at base of eyelashes once daily at bedtime; repeat procedure for second eye (use a clean applicator). (Patient not taking: Reported on 02/07/2018)  . methocarbamol (ROBAXIN) 500 MG tablet Take 1 tablet (500 mg total) by mouth at bedtime as needed for muscle spasms. (Patient not taking: Reported on 02/07/2018)   No facility-administered encounter medications on file as of 12/12/2018.     Activities of Daily Living In your present state of health, do you have any difficulty performing the following activities: 12/12/2018  Hearing? N  Vision? N  Difficulty concentrating or making decisions? N  Walking or climbing stairs? N  Dressing or bathing? N  Doing errands, shopping? N  Preparing Food and eating ? N  Using the Toilet? N  In the past six months, have you accidently leaked urine? Y  Do you have problems with loss of bowel control? N  Managing your Medications? N  Managing your Finances? N  Housekeeping or managing your Housekeeping? N  Some recent data might be hidden    Patient Care Team: Tonia Ghent, MD as PCP - General (Family Medicine)    Assessment:   This is a routine wellness examination for Stacie Huff.  Vision Screening Comments: Vision exam in 2019 with Dr. Ria Comment  Exercise Activities and Dietary recommendations Current Exercise Habits: Home exercise routine, Type of exercise: Other - see comments(elliptical bike), Time (Minutes):  60(60-100 minutes), Frequency (Times/Week): 7, Weekly Exercise (Minutes/Week): 420, Intensity: Moderate, Exercise limited by: None identified  Goals    . Increase physical activity     Starting 12/12/2018, I will continue to exercise for 60-100 minutes daily.        Fall Risk Fall Risk  12/12/2018 12/08/2017 10/21/2016 10/14/2016  Falls in the past year? 0 No No No    Depression Screen PHQ 2/9 Scores 12/12/2018 12/08/2017 10/21/2016 10/14/2016  PHQ - 2 Score 0 0 0 0  PHQ- 9 Score 0 0 - -  Cognitive Function MMSE - Mini Mental State Exam 12/12/2018 12/08/2017 10/14/2016  Orientation to time 5 5 5   Orientation to Place 5 5 5   Registration 3 3 3   Attention/ Calculation 0 0 0  Recall 3 3 3   Language- name 2 objects 0 0 0  Language- repeat 1 1 1   Language- follow 3 step command 0 3 3  Language- read & follow direction 0 0 0  Write a sentence 0 0 0  Copy design 0 0 0  Total score 17 20 20      PLEASE NOTE: A Mini-Cog screen was completed. Maximum score is 17. A value of 0 denotes this part of [place]stein MMSE was not completed or the patient failed this part of the Mini-Cog screening.   Mini-Cog Screening Orientation to Time - Max 5 pts Orientation to Place - Max 5 pts Registration - Max 3 pts Recall - Max 3 pts Language Repeat - Max 1 pts      Immunization History  Administered Date(s) Administered  . Influenza-Unspecified 04/12/2016, 04/05/2017, 04/19/2018  . Meningococcal Polysaccharide 11/11/2012  . Pneumococcal Conjugate-13 10/14/2016  . Pneumococcal Polysaccharide-23 12/08/2017  . Tdap 10/14/2016  . Yellow Fever 11/11/2012    Screening Tests Health Maintenance  Topic Date Due  . INFLUENZA VACCINE  02/11/2019  . MAMMOGRAM  11/25/2019  . TETANUS/TDAP  10/15/2026  . COLONOSCOPY  02/23/2028  . DEXA SCAN  Completed  . Hepatitis C Screening  Completed  . PNA vac Low Risk Adult  Completed      Plan:     I have personally reviewed, addressed, and noted the following in the  patient's chart:  A. Medical and social history B. Use of alcohol, tobacco or illicit drugs  C. Current medications and supplements D. Functional ability and status E.  Nutritional status F.  Physical activity G. Advance directives H. List of other physicians I.  Hospitalizations, surgeries, and ER visits in previous 12 months J.  Vitals (unless it is a telemedicine encounter) K. Screenings to include cognitive, depression, hearing, vision (NOTE: hearing and vision screenings not completed in telemedicine encounter) L. Referrals and appointments   In addition, I have reviewed and discussed with patient certain preventive protocols, quality metrics, and best practice recommendations. A written personalized care plan for preventive services and recommendations were provided to patient.  With patient's permission, we connected on 12/12/18 at 11:00 AM EDT by a video enabled telemedicine application. Two patient identifiers were used to ensure the encounter occurred with the correct person.    Patient was in home and writer was in office.   Signed,   Lindell Noe, MHA, BS, LPN Health Coach

## 2018-12-12 NOTE — Progress Notes (Signed)
PCP notes:   Health maintenance:  No gaps identified.  Abnormal screenings:   None  Patient concerns:   Patient requested refills for all medications. Request sent to PCP for approval.  Nurse concerns:  None  Next PCP appt:   02/14/19 @ 1415  I reviewed health advisor's note, was available for consultation on the day of service listed in this note, and agree with documentation and plan. Elsie Stain, MD.

## 2018-12-13 MED ORDER — HYDROCHLOROTHIAZIDE 25 MG PO TABS: 25.0000 mg | ORAL_TABLET | Freq: Every day | ORAL | 0 refills | Status: DC

## 2018-12-13 MED ORDER — CELECOXIB 100 MG PO CAPS: 100.0000 mg | ORAL_CAPSULE | Freq: Two times a day (BID) | ORAL | 0 refills | Status: DC

## 2018-12-13 MED ORDER — POTASSIUM CHLORIDE CRYS ER 10 MEQ PO TBCR: 10.0000 meq | EXTENDED_RELEASE_TABLET | Freq: Every day | ORAL | 0 refills | Status: DC

## 2018-12-13 MED ORDER — METHOCARBAMOL 500 MG PO TABS: 500.0000 mg | ORAL_TABLET | Freq: Every evening | ORAL | 0 refills | Status: DC | PRN

## 2018-12-13 MED ORDER — ESTRADIOL 1 MG PO TABS: 0.5000 mg | ORAL_TABLET | Freq: Every day | ORAL | 0 refills | Status: DC

## 2018-12-13 NOTE — Telephone Encounter (Signed)
Spoke with pt. Pt aware of the rx being sent in and the robaxin on hold. Pt states she is no longer using the Latisse and does not need a refill. Thanks.

## 2018-12-13 NOTE — Telephone Encounter (Signed)
I sent the prescription for potassium, hydrochlorothiazide, and Celebrex.  I sent 90 pills on those. I thought she was taking estradiol 0.5 mg a day so I sent the prescription with that direction.  I sent 90 pills so that would still last her 180 days with that usage rate. I sent the Robaxin but I put a note on the prescription to hold until she request the medication. Let me know if she wants a refill on the Latisse.  I sent the refills for 90 days otherwise so that would carry her through the follow-up appointment here.  Thanks.

## 2018-12-13 NOTE — Telephone Encounter (Signed)
Thanks    Med list updated

## 2018-12-15 ENCOUNTER — Encounter: Payer: Medicare Other | Admitting: Family Medicine

## 2018-12-16 ENCOUNTER — Encounter: Payer: Self-pay | Admitting: Family Medicine

## 2018-12-16 ENCOUNTER — Telehealth: Payer: Self-pay

## 2018-12-16 NOTE — Telephone Encounter (Signed)
Please see if she is willing to try tizanidine.  That may be a reasonable substitution and the PA on robaxin may likely be denied unless she has tried and failed mult other muscle relaxers.  Thanks.

## 2018-12-16 NOTE — Telephone Encounter (Signed)
Received fax from [facility] in regards to Stacie Huff. I know per last telephone note patient does not want this filled yet by them just put on hold but they sent fax stating this medication is not covered by her insurance and that we can try PA or try alternatives which are Tizanidine, Naproxen and Ibuprofen. Wanted to see how patient would like to proceed.  Left message for patient to call back to discuss. FYI to Dr. Damita Dunnings in meantime.

## 2018-12-16 NOTE — Telephone Encounter (Signed)
Waiting for patient to call back already called earlier today and left a message.

## 2018-12-19 NOTE — Telephone Encounter (Signed)
Patient would like to try Tizanidine.  Please send to [facility] in [place].

## 2018-12-20 MED ORDER — TIZANIDINE HCL 4 MG PO TABS: 2.0000 mg | ORAL_TABLET | Freq: Every evening | ORAL | 1 refills | Status: DC | PRN

## 2018-12-20 NOTE — Telephone Encounter (Signed)
Sent. Thanks.   

## 2018-12-20 NOTE — Addendum Note (Signed)
Addended by: Tonia Ghent on: 12/20/2018 06:28 AM   Modules accepted: Orders

## 2018-12-20 NOTE — Telephone Encounter (Signed)
Ms. Simonet notified by telephone that Dr. Damita Dunnings has sent the prescription for tizanidine to [facility].

## 2019-01-09 ENCOUNTER — Other Ambulatory Visit: Payer: Self-pay

## 2019-01-09 ENCOUNTER — Other Ambulatory Visit: Payer: Self-pay | Admitting: Family Medicine

## 2019-01-09 ENCOUNTER — Other Ambulatory Visit (INDEPENDENT_AMBULATORY_CARE_PROVIDER_SITE_OTHER): Payer: Medicare Other

## 2019-01-09 DIAGNOSIS — R609 Edema, unspecified: Secondary | ICD-10-CM

## 2019-01-09 LAB — COMPREHENSIVE METABOLIC PANEL
ALT: 14 U/L (ref 0–35)
AST: 15 U/L (ref 0–37)
Albumin: 3.9 g/dL (ref 3.5–5.2)
Alkaline Phosphatase: 71 U/L (ref 39–117)
BUN: 16 mg/dL (ref 6–23)
CO2: 30 mEq/L (ref 19–32)
Calcium: 8.6 mg/dL (ref 8.4–10.5)
Chloride: 105 mEq/L (ref 96–112)
Creatinine, Ser: 0.64 mg/dL (ref 0.40–1.20)
GFR: 91.7 mL/min (ref >60.00)
Glucose, Bld: 81 mg/dL (ref 70–99)
Potassium: 3.8 mEq/L (ref 3.5–5.1)
Sodium: 140 mEq/L (ref 135–145)
Total Bilirubin: 1.2 mg/dL (ref 0.2–1.2)
Total Protein: 6 g/dL (ref 6.0–8.3)

## 2019-02-14 ENCOUNTER — Other Ambulatory Visit: Payer: Self-pay

## 2019-02-14 ENCOUNTER — Encounter: Payer: Self-pay | Admitting: Family Medicine

## 2019-02-14 ENCOUNTER — Ambulatory Visit (INDEPENDENT_AMBULATORY_CARE_PROVIDER_SITE_OTHER): Payer: Medicare Other | Admitting: Family Medicine

## 2019-02-14 VITALS — BP 130/78 | HR 72 | Temp 98.4°F | Ht 64.0 in | Wt 147.0 lb

## 2019-02-14 DIAGNOSIS — G8929 Other chronic pain: Secondary | ICD-10-CM | POA: Diagnosis not present

## 2019-02-14 DIAGNOSIS — M549 Dorsalgia, unspecified: Secondary | ICD-10-CM | POA: Diagnosis not present

## 2019-02-14 DIAGNOSIS — Z79890 Hormone replacement therapy (postmenopausal): Secondary | ICD-10-CM

## 2019-02-14 DIAGNOSIS — R609 Edema, unspecified: Secondary | ICD-10-CM

## 2019-02-14 DIAGNOSIS — Z0000 Encounter for general adult medical examination without abnormal findings: Secondary | ICD-10-CM

## 2019-02-14 DIAGNOSIS — Z7189 Other specified counseling: Secondary | ICD-10-CM

## 2019-02-14 MED ORDER — ACETAMINOPHEN 500 MG PO TABS: 1000.0000 mg | ORAL_TABLET | Freq: Every day | ORAL | Status: DC

## 2019-02-14 MED ORDER — ESTRADIOL 1 MG PO TABS: 0.5000 mg | ORAL_TABLET | Freq: Every day | ORAL | 3 refills | Status: DC

## 2019-02-14 MED ORDER — ESTRADIOL 1 MG PO TABS: 0.5000 mg | ORAL_TABLET | Freq: Every day | ORAL | Status: DC

## 2019-02-14 MED ORDER — CELECOXIB 100 MG PO CAPS: 100.0000 mg | ORAL_CAPSULE | Freq: Every day | ORAL | 3 refills | Status: DC

## 2019-02-14 MED ORDER — POTASSIUM CHLORIDE CRYS ER 10 MEQ PO TBCR: 10.0000 meq | EXTENDED_RELEASE_TABLET | Freq: Every day | ORAL | 3 refills | Status: DC

## 2019-02-14 MED ORDER — HYDROCHLOROTHIAZIDE 25 MG PO TABS: 25.0000 mg | ORAL_TABLET | Freq: Every day | ORAL | 3 refills | Status: DC

## 2019-02-14 MED ORDER — CELECOXIB 100 MG PO CAPS: 100.0000 mg | ORAL_CAPSULE | Freq: Every day | ORAL | Status: DC

## 2019-02-14 NOTE — Progress Notes (Signed)
Flu vaccine - done yearly.   Tetanus done 2018 at pharmacy 10/14/16 PNA vaccine - she had local reaction.  D/w pt.   Shingles d/w pt. Encouraged.  Out of stock.   Colonoscopy 2019 Mammogram and DXA up to date.  Mammogram done 2020.   Advanced directive discussed with patient.  If patient incapacitated, then would have her husband designated.  Hep C screening - completed prev.  H/o edema.  On HCTZ daily.  Taking K with it at baseline.  No ADE on med.  Prev labs d/w pt.    HRT.  She is trying to taper her HRT as tolerated.  D/w pt.    She is taking celebrex daily.  She has chronic back pain.  She is taking tylenol at night and that helps some.  She is more stiff after getting up from sitting. She hasn't had to use tizanidine.    She celebrated her 68th birthday by water skiing.    PMH and SH reviewed ROS: Per HPI unless specifically indicated in ROS section   Meds, vitals, and allergies reviewed.   GEN: nad, alert and oriented HEENT: ncat NECK: supple w/o LA CV: rrr.  no murmur PULM: ctab, no inc wob ABD: soft, +bs EXT: no edema SKIN: no acute rash

## 2019-02-14 NOTE — Patient Instructions (Signed)
Update me as needed.  I would get a flu shot each fall.   Take care.  Glad to see you.

## 2019-02-15 NOTE — Assessment & Plan Note (Signed)
She is trying to taper her HRT as tolerated.  D/w pt. she is aware of pros and cons with med use.  She is trying to use the least amount possible.

## 2019-02-15 NOTE — Assessment & Plan Note (Signed)
Advance directive discussed with patient.  If patient incapacitated, then would have her husband designated.

## 2019-02-15 NOTE — Assessment & Plan Note (Addendum)
She is taking celebrex daily. She is taking tylenol at night and that helps some.  She is more stiff after getting up from sitting. She hasn't had to use tizanidine.  Continue as is.>25 minutes spent in face to face time with patient, >50% spent in counselling or coordination of care.

## 2019-02-15 NOTE — Assessment & Plan Note (Signed)
Flu vaccine - done yearly.   Tetanus done 2018 at pharmacy 10/14/16 PNA vaccine - she had local reaction.  D/w pt.   Shingles d/w pt. Encouraged.  Out of stock.   Colonoscopy 2019 Mammogram and DXA up to date.   Mammogram done 2020.   If patient incapacitated, then would have her husband designated.  Hep C screening - completed prev.  She celebrated her 68th birthday by water skiing.

## 2019-02-15 NOTE — Assessment & Plan Note (Signed)
On HCTZ daily.  Taking K with it at baseline.  No ADE on med.  Prev labs d/w pt. continue as is.

## 2019-04-11 DIAGNOSIS — H2513 Age-related nuclear cataract, bilateral: Secondary | ICD-10-CM | POA: Diagnosis not present

## 2019-04-11 DIAGNOSIS — H35033 Hypertensive retinopathy, bilateral: Secondary | ICD-10-CM | POA: Diagnosis not present

## 2019-04-17 DIAGNOSIS — Z23 Encounter for immunization: Secondary | ICD-10-CM | POA: Diagnosis not present

## 2019-07-21 ENCOUNTER — Encounter: Payer: Self-pay | Admitting: Family Medicine

## 2019-08-31 ENCOUNTER — Encounter: Payer: Self-pay | Admitting: Family Medicine

## 2019-09-01 ENCOUNTER — Ambulatory Visit: Payer: Medicare Other | Admitting: Family Medicine

## 2019-09-01 ENCOUNTER — Ambulatory Visit
Admission: RE | Admit: 2019-09-01 | Discharge: 2019-09-01 | Disposition: A | Discharge status: Home / Self Care | Payer: Medicare Other | Source: Ambulatory Visit | Attending: Family Medicine | Admitting: Family Medicine

## 2019-09-01 ENCOUNTER — Encounter: Payer: Self-pay | Admitting: Family Medicine

## 2019-09-01 ENCOUNTER — Ambulatory Visit (INDEPENDENT_AMBULATORY_CARE_PROVIDER_SITE_OTHER): Payer: Medicare Other | Admitting: Family Medicine

## 2019-09-01 ENCOUNTER — Other Ambulatory Visit: Payer: Self-pay

## 2019-09-01 ENCOUNTER — Ambulatory Visit: Payer: Medicare Other

## 2019-09-01 DIAGNOSIS — R1032 Left lower quadrant pain: Secondary | ICD-10-CM

## 2019-09-01 DIAGNOSIS — K5730 Diverticulosis of large intestine without perforation or abscess without bleeding: Secondary | ICD-10-CM | POA: Diagnosis not present

## 2019-09-01 LAB — CBC WITH DIFFERENTIAL/PLATELET
Basophils Absolute: 0 10*3/uL (ref 0.0–0.1)
Basophils Relative: 0.3 % (ref 0.0–3.0)
Eosinophils Absolute: 0.1 10*3/uL (ref 0.0–0.7)
Eosinophils Relative: 1.9 % (ref 0.0–5.0)
HCT: 34.9 % — ABNORMAL LOW (ref 36.0–46.0)
Hemoglobin: 11.8 g/dL — ABNORMAL LOW (ref 12.0–15.0)
Lymphocytes Relative: 33.8 % (ref 12.0–46.0)
Lymphs Abs: 2.2 10*3/uL (ref 0.7–4.0)
MCHC: 33.6 g/dL (ref 30.0–36.0)
MCV: 89.8 fl (ref 78.0–100.0)
Monocytes Absolute: 0.4 10*3/uL (ref 0.1–1.0)
Monocytes Relative: 5.7 % (ref 3.0–12.0)
Neutro Abs: 3.8 10*3/uL (ref 1.4–7.7)
Neutrophils Relative %: 58.3 % (ref 43.0–77.0)
Platelets: 248 10*3/uL (ref 150.0–400.0)
RBC: 3.89 Mil/uL (ref 3.87–5.11)
RDW: 13.5 % (ref 11.5–15.5)
WBC: 6.6 10*3/uL (ref 4.0–10.5)

## 2019-09-01 LAB — POCT URINALYSIS DIPSTICK
Bilirubin, UA: NEGATIVE
Blood, UA: NEGATIVE
Glucose, UA: NEGATIVE
Ketones, UA: NEGATIVE
Leukocytes, UA: NEGATIVE
Nitrite, UA: NEGATIVE
Protein, UA: NEGATIVE
Spec Grav, UA: 1.015 (ref 1.010–1.025)
Urobilinogen, UA: 0.2 E.U./dL
pH, UA: 6 (ref 5.0–8.0)

## 2019-09-01 LAB — COMPREHENSIVE METABOLIC PANEL
ALT: 14 U/L (ref 0–35)
AST: 20 U/L (ref 0–37)
Albumin: 4 g/dL (ref 3.5–5.2)
Alkaline Phosphatase: 71 U/L (ref 39–117)
BUN: 15 mg/dL (ref 6–23)
CO2: 29 mEq/L (ref 19–32)
Calcium: 9.3 mg/dL (ref 8.4–10.5)
Chloride: 103 mEq/L (ref 96–112)
Creatinine, Ser: 0.67 mg/dL (ref 0.40–1.20)
GFR: 86.82 mL/min (ref >60.00)
Glucose, Bld: 80 mg/dL (ref 70–99)
Potassium: 4 mEq/L (ref 3.5–5.1)
Sodium: 139 mEq/L (ref 135–145)
Total Bilirubin: 1.6 mg/dL — ABNORMAL HIGH (ref 0.2–1.2)
Total Protein: 6.3 g/dL (ref 6.0–8.3)

## 2019-09-01 MED ORDER — IOHEXOL 300 MG/ML  SOLN
100.0000 mL | Freq: Once | INTRAMUSCULAR | Status: AC | PRN
  Administered 2019-09-01: 100 mL via INTRAVENOUS

## 2019-09-01 MED ORDER — AMOXICILLIN-POT CLAVULANATE 875-125 MG PO TABS: 1.0000 | ORAL_TABLET | Freq: Two times a day (BID) | ORAL | 0 refills | Status: DC

## 2019-09-01 NOTE — Patient Instructions (Addendum)
Go to the lab on the way out.  Then see Stacie Huff on the way out.  I printed the augmentin rx in the meantime.  Take care.  Glad to see you.

## 2019-09-01 NOTE — Telephone Encounter (Signed)
Pt called wanting to know if there is any test you want to do before pt coming into office today @ 2.  I let her know you probably would have to evaluate before doing any test.  Best number [phone]

## 2019-09-01 NOTE — Progress Notes (Signed)
This visit occurred during the SARS-CoV-2 public health emergency.  Safety protocols were in place, including screening questions prior to the visit, additional usage of staff PPE, and extensive cleaning of exam room while observing appropriate contact time as indicated for disinfecting solutions.  Pt c/o LLQ pain x 2 days. Loose stools x 1 day, with 100F temp last night. Liquid diet x 1 day, pain seems worse today, 5/10, but is some improved after taking Tylenol, now a 3/10. No Covid exposure. Pt has received both covid vaccines. Denies N/V.    Worse abd cramping today.  3 small stools today.  No blood in stool.  She is on liquid diet recently.  No black stools.    BP was 122/76 on recent check, with BP likely influenced by pain today.    She is down to 1/2 tab on HRT, d/w pt.    She doesn't have dysuria.    She recently gave blood to the [facility].  Meds, vitals, and allergies reviewed.   ROS: Per HPI unless specifically indicated in ROS section   GEN: nad, alert and oriented HEENT: mucous membranes moist NECK: supple w/o LA CV: rrr.  PULM: ctab, no inc wob ABD: soft, +bs, left lower quadrant tender to palpation without rebound. EXT: no edema SKIN: no acute rash

## 2019-09-03 ENCOUNTER — Encounter: Payer: Self-pay | Admitting: Family Medicine

## 2019-09-03 DIAGNOSIS — R1032 Left lower quadrant pain: Secondary | ICD-10-CM | POA: Insufficient documentation

## 2019-09-03 DIAGNOSIS — I700 Atherosclerosis of aorta: Secondary | ICD-10-CM | POA: Insufficient documentation

## 2019-09-03 NOTE — Assessment & Plan Note (Signed)
Presumed diverticulitis.  Discussed with patient.  Still okay for outpatient follow-up.  Sent for CT without other cause seen.  Questionable early diverticulitis on the scan.  Reasonable to treat as diverticulitis.  Start Augmentin.  Continue clear liquid diet in the meantime.  Gradually advance her diet as tolerated.  See notes on labs.  It was expected that her hemoglobin would be slightly low since she had recently given blood at the [facility].  At this point still okay for outpatient follow-up.  She was called about her CT results soon after it was completed, on the same day of the office visit.  At least 30 minutes were devoted to patient care in this encounter (this can potentially include time spent reviewing the patient's file/history, interviewing and examining the patient, counseling/reviewing plan with patient, ordering referrals, ordering tests, reviewing relevant laboratory or x-ray data, and documenting the encounter).

## 2019-09-06 LAB — POCT I-STAT CREATININE: Creatinine, Ser: 0.6 mg/dL (ref 0.44–1.00)

## 2019-10-17 DIAGNOSIS — M25551 Pain in right hip: Secondary | ICD-10-CM | POA: Diagnosis not present

## 2019-11-30 DIAGNOSIS — Z1231 Encounter for screening mammogram for malignant neoplasm of breast: Secondary | ICD-10-CM | POA: Diagnosis not present

## 2019-11-30 DIAGNOSIS — Z780 Asymptomatic menopausal state: Secondary | ICD-10-CM | POA: Diagnosis not present

## 2019-11-30 LAB — HM MAMMOGRAPHY

## 2019-11-30 LAB — HM DEXA SCAN: HM Dexa Scan: NORMAL

## 2019-12-03 ENCOUNTER — Other Ambulatory Visit: Payer: Self-pay | Admitting: Family Medicine

## 2019-12-06 ENCOUNTER — Encounter: Payer: Self-pay | Admitting: Family Medicine

## 2019-12-06 NOTE — Telephone Encounter (Signed)
Contacted [facility] and they report pt last script for one year supply of both meds has already been fulfilled. She will not need another script until Aug.  LVM for pt so this can be explained.

## 2019-12-12 ENCOUNTER — Telehealth: Payer: Self-pay | Admitting: *Deleted

## 2019-12-12 NOTE — Telephone Encounter (Signed)
Erroneous encounter

## 2019-12-15 ENCOUNTER — Encounter: Payer: Self-pay | Admitting: Family Medicine

## 2019-12-19 ENCOUNTER — Other Ambulatory Visit: Payer: Self-pay | Admitting: *Deleted

## 2019-12-19 ENCOUNTER — Other Ambulatory Visit: Payer: Self-pay | Admitting: Family Medicine

## 2019-12-19 MED ORDER — HYDROCHLOROTHIAZIDE 25 MG PO TABS: 25.0000 mg | ORAL_TABLET | Freq: Every day | ORAL | 0 refills | Status: DC

## 2019-12-19 MED ORDER — ESTRADIOL 1 MG PO TABS: 0.5000 mg | ORAL_TABLET | Freq: Every day | ORAL | 0 refills | Status: DC

## 2019-12-19 MED ORDER — POTASSIUM CHLORIDE CRYS ER 10 MEQ PO TBCR: 10.0000 meq | EXTENDED_RELEASE_TABLET | Freq: Every day | ORAL | 0 refills | Status: DC

## 2019-12-19 NOTE — Telephone Encounter (Signed)
90 day refill sent to last until scheduled appointment.

## 2019-12-19 NOTE — Telephone Encounter (Signed)
Patient called back . She scheduled annual visit for aug. But is still requesting refills for the following prescriptions    hydrochlorothiazide (HYDRODIURIL) 25 MG tablet estradiol (ESTRACE) 1 MG tablet potassium chloride (K-DUR) 10 MEQ tablet   She stated that [facility] is telling her they are needing refills for the medication

## 2019-12-26 ENCOUNTER — Other Ambulatory Visit: Payer: Self-pay | Admitting: *Deleted

## 2020-01-31 ENCOUNTER — Other Ambulatory Visit: Payer: Self-pay | Admitting: Family Medicine

## 2020-01-31 DIAGNOSIS — I700 Atherosclerosis of aorta: Secondary | ICD-10-CM

## 2020-01-31 DIAGNOSIS — R609 Edema, unspecified: Secondary | ICD-10-CM

## 2020-02-06 ENCOUNTER — Other Ambulatory Visit (INDEPENDENT_AMBULATORY_CARE_PROVIDER_SITE_OTHER): Payer: Medicare Other

## 2020-02-06 ENCOUNTER — Other Ambulatory Visit: Payer: Self-pay

## 2020-02-06 DIAGNOSIS — I700 Atherosclerosis of aorta: Secondary | ICD-10-CM | POA: Diagnosis not present

## 2020-02-06 DIAGNOSIS — R609 Edema, unspecified: Secondary | ICD-10-CM

## 2020-02-06 LAB — COMPREHENSIVE METABOLIC PANEL
ALT: 14 U/L (ref 0–35)
AST: 19 U/L (ref 0–37)
Albumin: 4.2 g/dL (ref 3.5–5.2)
Alkaline Phosphatase: 68 U/L (ref 39–117)
BUN: 22 mg/dL (ref 6–23)
CO2: 29 mEq/L (ref 19–32)
Calcium: 9.7 mg/dL (ref 8.4–10.5)
Chloride: 103 mEq/L (ref 96–112)
Creatinine, Ser: 0.73 mg/dL (ref 0.40–1.20)
GFR: 78.54 mL/min (ref >60.00)
Glucose, Bld: 86 mg/dL (ref 70–99)
Potassium: 3.9 mEq/L (ref 3.5–5.1)
Sodium: 138 mEq/L (ref 135–145)
Total Bilirubin: 1.7 mg/dL — ABNORMAL HIGH (ref 0.2–1.2)
Total Protein: 6.6 g/dL (ref 6.0–8.3)

## 2020-02-06 LAB — CBC WITH DIFFERENTIAL/PLATELET
Basophils Absolute: 0 10*3/uL (ref 0.0–0.1)
Basophils Relative: 0.3 % (ref 0.0–3.0)
Eosinophils Absolute: 0.1 10*3/uL (ref 0.0–0.7)
Eosinophils Relative: 1.6 % (ref 0.0–5.0)
HCT: 38.3 % (ref 36.0–46.0)
Hemoglobin: 12.7 g/dL (ref 12.0–15.0)
Lymphocytes Relative: 32.6 % (ref 12.0–46.0)
Lymphs Abs: 2 10*3/uL (ref 0.7–4.0)
MCHC: 33.3 g/dL (ref 30.0–36.0)
MCV: 90.2 fl (ref 78.0–100.0)
Monocytes Absolute: 0.5 10*3/uL (ref 0.1–1.0)
Monocytes Relative: 8 % (ref 3.0–12.0)
Neutro Abs: 3.5 10*3/uL (ref 1.4–7.7)
Neutrophils Relative %: 57.5 % (ref 43.0–77.0)
Platelets: 229 10*3/uL (ref 150.0–400.0)
RBC: 4.24 Mil/uL (ref 3.87–5.11)
RDW: 14.1 % (ref 11.5–15.5)
WBC: 6.1 10*3/uL (ref 4.0–10.5)

## 2020-02-06 LAB — LIPID PANEL
Cholesterol: 183 mg/dL (ref 0–200)
HDL: 68.5 mg/dL (ref >39.00)
LDL Cholesterol: 103 mg/dL — ABNORMAL HIGH (ref 0–99)
NonHDL: 114.12
Total CHOL/HDL Ratio: 3
Triglycerides: 55 mg/dL (ref 0.0–149.0)
VLDL: 11 mg/dL (ref 0.0–40.0)

## 2020-02-08 ENCOUNTER — Other Ambulatory Visit: Payer: Self-pay | Admitting: Family Medicine

## 2020-02-16 ENCOUNTER — Other Ambulatory Visit: Payer: Self-pay

## 2020-02-16 ENCOUNTER — Ambulatory Visit (INDEPENDENT_AMBULATORY_CARE_PROVIDER_SITE_OTHER): Payer: Medicare Other

## 2020-02-16 VITALS — BP 128/80 | Ht 64.0 in | Wt 149.0 lb

## 2020-02-16 DIAGNOSIS — Z0000 Encounter for general adult medical examination without abnormal findings: Secondary | ICD-10-CM

## 2020-02-16 NOTE — Progress Notes (Signed)
Subjective:   Stacie Huff is a 69 y.o. female who presents for Medicare Annual (Subsequent) preventive examination.  Review of Systems: N/A     I connected with the patient today by telephone and verified that I am speaking with the correct person using two identifiers. Location patient: home Location nurse: work Persons participating in the virtual visit: patient, Marine scientist.   I discussed the limitations, risks, security and privacy concerns of performing an evaluation and management service by telephone and the availability of in person appointments. I also discussed with the patient that there may be a patient responsible charge related to this service. The patient expressed understanding and verbally consented to this telephonic visit.    Interactive audio and video telecommunications were attempted between this nurse and patient, however failed, due to patient having technical difficulties OR patient did not have access to video capability.  We continued and completed visit with audio only.     Cardiac Risk Factors include: advanced age (>39men, >39 women)     Objective:    Today's Vitals   02/16/20 0817  BP: 128/80  Weight: 149 lb (67.6 kg)  Height: 5\' 4"  (1.626 m)   Body mass index is 25.58 kg/m.  Advanced Directives 02/16/2020 12/12/2018 12/08/2017 10/14/2016  Does Patient Have a Medical Advance Directive? Yes Yes No No  Type of Paramedic of [place];Living will [place];Living will - -  Copy of [place] in Chart? Yes - validated most recent copy scanned in chart (See row information) No - copy requested - -  Would patient like information on creating a medical advance directive? - - No - Patient declined -    Current Medications (verified) Outpatient Encounter Medications as of 02/16/2020  Medication Sig  . acetaminophen (TYLENOL) 500 MG tablet Take 2 tablets (1,000 mg total) by mouth at bedtime.  Marland Kitchen  amoxicillin-clavulanate (AUGMENTIN) 875-125 MG tablet Take 1 tablet by mouth 2 (two) times daily.  . Calcium Carb-Cholecalciferol (CALCIUM 600/VITAMIN D3 PO) Take by mouth daily.  . celecoxib (CELEBREX) 100 MG capsule Take 1 capsule (100 mg total) by mouth daily.  Marland Kitchen estradiol (ESTRACE) 1 MG tablet Take 0.5-1 tablets (0.5-1 mg total) by mouth daily.  . hydrochlorothiazide (HYDRODIURIL) 25 MG tablet TAKE 1 TABLET BY MOUTH  DAILY  . OVER THE COUNTER MEDICATION daily. Fish oil 1200, omega 3- 600 mg, with vitamin d 3- 2000 units  . potassium chloride (KLOR-CON) 10 MEQ tablet Take 1 tablet (10 mEq total) by mouth daily.  Marland Kitchen tiZANidine (ZANAFLEX) 4 MG tablet Take 0.5-1 tablets (2-4 mg total) by mouth at bedtime as needed for muscle spasms.   No facility-administered encounter medications on file as of 02/16/2020.    Allergies (verified) Demerol [meperidine], Gabapentin, Pneumococcal vaccines, and Vicodin [hydrocodone-acetaminophen]   History: Past Medical History:  Diagnosis Date  . Arthritis   . History of postmenopausal HRT   . Urine, incontinence, stress female    Past Surgical History:  Procedure Laterality Date  . APPENDECTOMY  1960  . BLADDER REPAIR  1999  . BLADDER REPAIR  2004  . BREAST ENHANCEMENT SURGERY  1982  . CHOLECYSTECTOMY  1991  . LAMINECTOMY  3 total.  1990 x1, 2000 x2  . PARTIAL HYSTERECTOMY  1982  . TONSILLECTOMY AND ADENOIDECTOMY  1956   Family History  Problem Relation Age of Onset  . Sarcoidosis Mother   . Colon cancer Father        at age 34  .  Breast cancer Neg Hx   . Esophageal cancer Neg Hx   . Rectal cancer Neg Hx   . Stomach cancer Neg Hx    Social History   Socioeconomic History  . Marital status: Married    Spouse name: Not on file  . Number of children: Not on file  . Years of education: Not on file  . Highest education level: Not on file  Occupational History  . Not on file  Tobacco Use  . Smoking status: Never Smoker  . Smokeless tobacco:  Never Used  Vaping Use  . Vaping Use: Never used  Substance and Sexual Activity  . Alcohol use: No    Alcohol/week: 0.0 standard drinks  . Drug use: No  . Sexual activity: Not Currently  Other Topics Concern  . Not on file  Social History Narrative   Married 2015   Retired Hospital doctor   Enjoys water skiing.     Social Determinants of Health   Financial Resource Strain: Low Risk   . Difficulty of Paying Living Expenses: Not hard at all  Food Insecurity: No Food Insecurity  . Worried About Charity fundraiser in the Last Year: Never true  . Ran Out of Food in the Last Year: Never true  Transportation Needs: No Transportation Needs  . Lack of Transportation (Medical): No  . Lack of Transportation (Non-Medical): No  Physical Activity: Sufficiently Active  . Days of Exercise per Week: 7 days  . Minutes of Exercise per Session: 60 min  Stress: No Stress Concern Present  . Feeling of Stress : Not at all  Social Connections:   . Frequency of Communication with Friends and Family:   . Frequency of Social Gatherings with Friends and Family:   . Attends Religious Services:   . Active Member of Clubs or Organizations:   . Attends Archivist Meetings:   Marland Kitchen Marital Status:     Tobacco Counseling Counseling given: Not Answered   Clinical Intake:  Pre-visit preparation completed: Yes  Pain : No/denies pain     Nutritional Status: BMI 25 -29 Overweight Nutritional Risks: None Diabetes: No  How often do you need to have someone help you when you read instructions, pamphlets, or other written materials from your doctor or pharmacy?: 1 - Never What is the last grade level you completed in school?: nursing degree, bs degree  Diabetic: No Nutrition Risk Assessment:  Has the patient had any N/V/D within the last 2 months?  No  Does the patient have any non-healing wounds?  No  Has the patient had any unintentional weight loss or weight gain?  No   Diabetes:  Is the  patient diabetic?  No  If diabetic, was a CBG obtained today?  N/A Did the patient bring in their glucometer from home?  N/A How often do you monitor your CBG's? N/A.   Financial Strains and Diabetes Management:  Are you having any financial strains with the device, your supplies or your medication? N/A.  Does the patient want to be seen by Chronic Care Management for management of their diabetes?  N/A Would the patient like to be referred to a Nutritionist or for Diabetic Management?  N/A     Interpreter Needed?: No  Information entered by :: CJohnson, LPN   Activities of Daily Living In your present state of health, do you have any difficulty performing the following activities: 02/16/2020  Hearing? N  Vision? N  Difficulty concentrating or making decisions? N  Walking or climbing stairs? N  Dressing or bathing? N  Doing errands, shopping? N  Preparing Food and eating ? N  Using the Toilet? N  In the past six months, have you accidently leaked urine? Y  Comment wears pads  Do you have problems with loss of bowel control? N  Managing your Medications? N  Managing your Finances? N  Housekeeping or managing your Housekeeping? N  Some recent data might be hidden    Patient Care Team: Tonia Ghent, MD as PCP - General (Family Medicine)  Indicate any recent Medical Services you may have received from other than [facility] providers in the past year (date may be approximate).     Assessment:   This is a routine wellness examination for Shavette.  Hearing/Vision screen  Hearing Screening   125Hz  250Hz  500Hz  1000Hz  2000Hz  3000Hz  4000Hz  6000Hz  8000Hz   Right ear:           Left ear:           Vision Screening Comments: Patient gets annual eye exams   Dietary issues and exercise activities discussed: Current Exercise Habits: Home exercise routine, Type of exercise: Other - see comments (elliptical), Time (Minutes): 60, Frequency (Times/Week): 7, Weekly Exercise (Minutes/Week):  420, Intensity: Moderate, Exercise limited by: None identified  Goals    . Increase physical activity     Starting 12/12/2018, I will continue to exercise for 60-100 minutes daily.     . Patient Stated     02/16/2020, I will continue to ride my elliptical 7 days a week for 1 hour.      Depression Screen PHQ 2/9 Scores 02/16/2020 02/14/2019 12/12/2018 12/08/2017 10/21/2016 10/14/2016  PHQ - 2 Score 0 0 0 0 0 0  PHQ- 9 Score 0 - 0 0 - -    Fall Risk Fall Risk  02/16/2020 02/14/2019 12/12/2018 12/08/2017 10/21/2016  Falls in the past year? 0 0 0 No No  Number falls in past yr: 0 - - - -  Injury with Fall? 0 - - - -  Risk for fall due to : No Fall Risks - - - -  Follow up Falls evaluation completed;Falls prevention discussed - - - -    Any stairs in or around the home? Yes  If so, are there any without handrails? No  Home free of loose throw rugs in walkways, pet beds, electrical cords, etc? Yes  Adequate lighting in your home to reduce risk of falls? Yes   ASSISTIVE DEVICES UTILIZED TO PREVENT FALLS:  Life alert? No  Use of a cane, walker or w/c? No  Grab bars in the bathroom? No  Shower chair or bench in shower? No  Elevated toilet seat or a handicapped toilet? No   TIMED UP AND GO:  Was the test performed? N/A, telephonic visit .    Cognitive Function: MMSE - Mini Mental State Exam 02/16/2020 12/12/2018 12/08/2017 10/14/2016  Orientation to time 5 5 5 5   Orientation to Place 5 5 5 5   Registration 3 3 3 3   Attention/ Calculation 5 0 0 0  Recall 3 3 3 3   Language- name 2 objects - 0 0 0  Language- repeat 1 1 1 1   Language- follow 3 step command - 0 3 3  Language- read & follow direction - 0 0 0  Write a sentence - 0 0 0  Copy design - 0 0 0  Total score - 17 20 20   Mini Cog  Mini-Cog screen  was completed. Maximum score is 22. A value of 0 denotes this part of the MMSE was not completed or the patient failed this part of the Mini-Cog screening.       Immunizations Immunization History   Administered Date(s) Administered  . Influenza, High Dose Seasonal PF 04/05/2017, 04/17/2019  . Influenza,inj,Quad PF,6+ Mos 04/19/2018  . Influenza-Unspecified 04/12/2016, 04/05/2017, 04/19/2018  . Meningococcal Polysaccharide 11/11/2012  . Moderna SARS-COVID-2 Vaccination 07/21/2019, 08/18/2019  . Pneumococcal Conjugate-13 10/14/2016  . Pneumococcal Polysaccharide-23 12/08/2017  . Tdap 10/14/2016  . Yellow Fever 11/11/2012    TDAP status: Up to date Flu Vaccine status: due, will be available in the office at the end of August  Pneumococcal vaccine status: Up to date Covid-19 vaccine status: Completed vaccines  Qualifies for Shingles Vaccine? Yes   Zostavax completed No   Shingrix Completed?: No.    Education has been provided regarding the importance of this vaccine. Patient has been advised to call insurance company to determine out of pocket expense if they have not yet received this vaccine. Advised may also receive vaccine at local pharmacy or Health Dept. Verbalized acceptance and understanding.  Screening Tests Health Maintenance  Topic Date Due  . INFLUENZA VACCINE  02/11/2020  . MAMMOGRAM  11/29/2020  . TETANUS/TDAP  10/15/2026  . COLONOSCOPY  02/23/2028  . DEXA SCAN  Completed  . COVID-19 Vaccine  Completed  . Hepatitis C Screening  Completed  . PNA vac Low Risk Adult  Completed    Health Maintenance  Health Maintenance Due  Topic Date Due  . INFLUENZA VACCINE  02/11/2020    Colorectal cancer screening: Completed 02/22/2018. Repeat every 10 years Mammogram status: Completed 11/30/2019. Repeat every year Bone Density status: Completed 11/30/2019. Results reflect: Bone density results: NORMAL. Repeat every 2-5 years.  Lung Cancer Screening: (Low Dose CT Chest recommended if Age 33-80 years, 30 pack-year currently smoking OR have quit w/in 15years.) does not qualify.    Additional Screening:  Hepatitis C Screening: does qualify; Completed 10/14/2016  Vision  Screening: Recommended annual ophthalmology exams for early detection of glaucoma and other disorders of the eye. Is the patient up to date with their annual eye exam?  Yes  Who is the provider or what is the name of the office in which the patient attends annual eye exams? Dr. Aretha Parrot If pt is not established with a provider, would they like to be referred to a provider to establish care? No .   Dental Screening: Recommended annual dental exams for proper oral hygiene  Community Resource Referral / Chronic Care Management: CRR required this visit?  No   CCM required this visit?  No      Plan:     I have personally reviewed and noted the following in the patient's chart:   . Medical and social history . Use of alcohol, tobacco or illicit drugs  . Current medications and supplements . Functional ability and status . Nutritional status . Physical activity . Advanced directives . List of other physicians . Hospitalizations, surgeries, and ER visits in previous 12 months . Vitals . Screenings to include cognitive, depression, and falls . Referrals and appointments  In addition, I have reviewed and discussed with patient certain preventive protocols, quality metrics, and best practice recommendations. A written personalized care plan for preventive services as well as general preventive health recommendations were provided to patient.   Due to this being a telephonic visit, the after visit summary with patients personalized plan was offered  to patient via mail or my-chart. Patient preferred to pick up at office at next visit.  Andrez Grime, LPN   11/13/6142

## 2020-02-16 NOTE — Patient Instructions (Signed)
[name] , Thank you for taking time to come for your Medicare Wellness Visit. I appreciate your ongoing commitment to your health goals. Please review the following plan we discussed and let me know if I can assist you in the future.   Screening recommendations/referrals: Colonoscopy: Up to date, completed 02/22/2018, due 02/2028 Mammogram: Up to date, completed 11/30/2019, due 11/2020 Bone Density: Up to date, completed 11/30/2019, due 2-5 years Recommended yearly ophthalmology/optometry visit for glaucoma screening and checkup Recommended yearly dental visit for hygiene and checkup  Vaccinations: Influenza vaccine: due, will be available in the office at the end of August  Pneumococcal vaccine: Completed series Tdap vaccine: Up to date, completed 10/14/2016, due 10/2026 Shingles vaccine: due, check with your insurance regarding coverage   Covid-19:Completed series  Advanced directives: copy in chart  Conditions/risks identified: none  Next appointment: Follow up in one year for your annual wellness visit    Preventive Care 65 Years and Older, Female Preventive care refers to lifestyle choices and visits with your health care provider that can promote health and wellness. What does preventive care include?  A yearly physical exam. This is also called an annual well check.  Dental exams once or twice a year.  Routine eye exams. Ask your health care provider how often you should have your eyes checked.  Personal lifestyle choices, including:  Daily care of your teeth and gums.  Regular physical activity.  Eating a healthy diet.  Avoiding tobacco and drug use.  Limiting alcohol use.  Practicing safe sex.  Taking low-dose aspirin every day.  Taking vitamin and mineral supplements as recommended by your health care provider. What happens during an annual well check? The services and screenings done by your health care provider during your annual well check will depend on  your age, overall health, lifestyle risk factors, and family history of disease. Counseling  Your health care provider may ask you questions about your:  Alcohol use.  Tobacco use.  Drug use.  Emotional well-being.  Home and relationship well-being.  Sexual activity.  Eating habits.  History of falls.  Memory and ability to understand (cognition).  Work and work Statistician.  Reproductive health. Screening  You may have the following tests or measurements:  Height, weight, and BMI.  Blood pressure.  Lipid and cholesterol levels. These may be checked every 5 years, or more frequently if you are over 76 years old.  Skin check.  Lung cancer screening. You may have this screening every year starting at age 74 if you have a 30-pack-year history of smoking and currently smoke or have quit within the past 15 years.  Fecal occult blood test (FOBT) of the stool. You may have this test every year starting at age 43.  Flexible sigmoidoscopy or colonoscopy. You may have a sigmoidoscopy every 5 years or a colonoscopy every 10 years starting at age 23.  Hepatitis C blood test.  Hepatitis B blood test.  Sexually transmitted disease (STD) testing.  Diabetes screening. This is done by checking your blood sugar (glucose) after you have not eaten for a while (fasting). You may have this done every 1-3 years.  Bone density scan. This is done to screen for osteoporosis. You may have this done starting at age 61.  Mammogram. This may be done every 1-2 years. Talk to your health care provider about how often you should have regular mammograms. Talk with your health care provider about your test results, treatment options, and if necessary, the need  for more tests. Vaccines  Your health care provider may recommend certain vaccines, such as:  Influenza vaccine. This is recommended every year.  Tetanus, diphtheria, and acellular pertussis (Tdap, Td) vaccine. You may need a Td booster  every 10 years.  Zoster vaccine. You may need this after age 40.  Pneumococcal 13-valent conjugate (PCV13) vaccine. One dose is recommended after age 35.  Pneumococcal polysaccharide (PPSV23) vaccine. One dose is recommended after age 37. Talk to your health care provider about which screenings and vaccines you need and how often you need them. This information is not intended to replace advice given to you by your health care provider. Make sure you discuss any questions you have with your health care provider. Document Released: 07/26/2015 Document Revised: 03/18/2016 Document Reviewed: 04/30/2015 Elsevier Interactive Patient Education  2017 [place] Prevention in the Home Falls can cause injuries. They can happen to people of all ages. There are many things you can do to make your home safe and to help prevent falls. What can I do on the outside of my home?  Regularly fix the edges of walkways and driveways and fix any cracks.  Remove anything that might make you trip as you walk through a door, such as a raised step or threshold.  Trim any bushes or trees on the path to your home.  Use bright outdoor lighting.  Clear any walking paths of anything that might make someone trip, such as rocks or tools.  Regularly check to see if handrails are loose or broken. Make sure that both sides of any steps have handrails.  Any raised decks and porches should have guardrails on the edges.  Have any leaves, snow, or ice cleared regularly.  Use sand or salt on walking paths during winter.  Clean up any spills in your garage right away. This includes oil or grease spills. What can I do in the bathroom?  Use night lights.  Install grab bars by the toilet and in the tub and shower. Do not use towel bars as grab bars.  Use non-skid mats or decals in the tub or shower.  If you need to sit down in the shower, use a plastic, non-slip stool.  Keep the floor dry. Clean up any  water that spills on the floor as soon as it happens.  Remove soap buildup in the tub or shower regularly.  Attach bath mats securely with double-sided non-slip rug tape.  Do not have throw rugs and other things on the floor that can make you trip. What can I do in the bedroom?  Use night lights.  Make sure that you have a light by your bed that is easy to reach.  Do not use any sheets or blankets that are too big for your bed. They should not hang down onto the floor.  Have a firm chair that has side arms. You can use this for support while you get dressed.  Do not have throw rugs and other things on the floor that can make you trip. What can I do in the kitchen?  Clean up any spills right away.  Avoid walking on wet floors.  Keep items that you use a lot in easy-to-reach places.  If you need to reach something above you, use a strong step stool that has a grab bar.  Keep electrical cords out of the way.  Do not use floor polish or wax that makes floors slippery. If you must use wax, use  non-skid floor wax.  Do not have throw rugs and other things on the floor that can make you trip. What can I do with my stairs?  Do not leave any items on the stairs.  Make sure that there are handrails on both sides of the stairs and use them. Fix handrails that are broken or loose. Make sure that handrails are as long as the stairways.  Check any carpeting to make sure that it is firmly attached to the stairs. Fix any carpet that is loose or worn.  Avoid having throw rugs at the top or bottom of the stairs. If you do have throw rugs, attach them to the floor with carpet tape.  Make sure that you have a light switch at the top of the stairs and the bottom of the stairs. If you do not have them, ask someone to add them for you. What else can I do to help prevent falls?  Wear shoes that:  Do not have high heels.  Have rubber bottoms.  Are comfortable and fit you well.  Are closed  at the toe. Do not wear sandals.  If you use a stepladder:  Make sure that it is fully opened. Do not climb a closed stepladder.  Make sure that both sides of the stepladder are locked into place.  Ask someone to hold it for you, if possible.  Clearly mark and make sure that you can see:  Any grab bars or handrails.  First and last steps.  Where the edge of each step is.  Use tools that help you move around (mobility aids) if they are needed. These include:  Canes.  Walkers.  Scooters.  Crutches.  Turn on the lights when you go into a dark area. Replace any light bulbs as soon as they burn out.  Set up your furniture so you have a clear path. Avoid moving your furniture around.  If any of your floors are uneven, fix them.  If there are any pets around you, be aware of where they are.  Review your medicines with your doctor. Some medicines can make you feel dizzy. This can increase your chance of falling. Ask your doctor what other things that you can do to help prevent falls. This information is not intended to replace advice given to you by your health care provider. Make sure you discuss any questions you have with your health care provider. Document Released: 04/25/2009 Document Revised: 12/05/2015 Document Reviewed: 08/03/2014 Elsevier Interactive Patient Education  2017 [employer].

## 2020-02-16 NOTE — Progress Notes (Signed)
PCP notes:  Health Maintenance: shingrix- declined   Abnormal Screenings: none   Patient concerns: Right leg pain- nerve pain (ruled out possible DVT with patient)   Nurse concerns: none   Next PCP appt.: 02/20/2020 @ 12 pm

## 2020-02-19 ENCOUNTER — Other Ambulatory Visit: Payer: Self-pay | Admitting: Family Medicine

## 2020-02-19 NOTE — Telephone Encounter (Signed)
Electronic refill request. Estradiol Patient coming for CPE tomorrow 02/20/2020 Last Filled:    90 tablet 0 12/19/2019  Please advise.

## 2020-02-20 ENCOUNTER — Other Ambulatory Visit: Payer: Self-pay

## 2020-02-20 ENCOUNTER — Ambulatory Visit (INDEPENDENT_AMBULATORY_CARE_PROVIDER_SITE_OTHER): Payer: Medicare Other | Admitting: Family Medicine

## 2020-02-20 ENCOUNTER — Encounter: Payer: Self-pay | Admitting: Family Medicine

## 2020-02-20 VITALS — BP 140/68 | HR 61 | Temp 96.9°F | Ht 63.5 in | Wt 150.2 lb

## 2020-02-20 DIAGNOSIS — Z0000 Encounter for general adult medical examination without abnormal findings: Secondary | ICD-10-CM | POA: Diagnosis not present

## 2020-02-20 DIAGNOSIS — Z79890 Hormone replacement therapy (postmenopausal): Secondary | ICD-10-CM

## 2020-02-20 DIAGNOSIS — R609 Edema, unspecified: Secondary | ICD-10-CM

## 2020-02-20 DIAGNOSIS — M549 Dorsalgia, unspecified: Secondary | ICD-10-CM

## 2020-02-20 DIAGNOSIS — G8929 Other chronic pain: Secondary | ICD-10-CM

## 2020-02-20 DIAGNOSIS — Z7189 Other specified counseling: Secondary | ICD-10-CM

## 2020-02-20 MED ORDER — CELECOXIB 100 MG PO CAPS: 100.0000 mg | ORAL_CAPSULE | Freq: Two times a day (BID) | ORAL | 3 refills | Status: DC | PRN

## 2020-02-20 MED ORDER — HYDROCHLOROTHIAZIDE 25 MG PO TABS: 25.0000 mg | ORAL_TABLET | Freq: Every day | ORAL | 3 refills | Status: DC

## 2020-02-20 MED ORDER — POTASSIUM CHLORIDE CRYS ER 10 MEQ PO TBCR: 10.0000 meq | EXTENDED_RELEASE_TABLET | Freq: Every day | ORAL | 3 refills | Status: DC

## 2020-02-20 NOTE — Progress Notes (Signed)
This visit occurred during the SARS-CoV-2 public health emergency.  Safety protocols were in place, including screening questions prior to the visit, additional usage of staff PPE, and extensive cleaning of exam room while observing appropriate contact time as indicated for disinfecting solutions.  R leg pain.  Taking celebrex at baseline, taking BID recently, with relief of pain.  Prev pain knee and distal is better.  No L leg pain.  Previously had been taking Celebrex daily.  Routine cautions discussed with patient.  No new emergent symptoms.  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 I thanked her for her effort.  Edema controlled with HCTZ.    Using medication without problems or lightheadedness: yes Chest pain with exertion:no Edema:no Short of breath:no Still on HCTZ with K dur.    She is tapering HRT, down to 1/2 tab daily.  Discussed slow taper.  She may be able to cut out 1/2 tablet/week and then do that for period of time before removing another half tablet.  She was still able to water ski this year.  D/w pt.    Flu vaccine - done yearly.  Tetanus done 2018 at pharmacy 10/14/16 PNA vaccine - up to date.   Shingles d/w pt. Encouraged. covid vaccine 2021 Colonoscopy 2019 Mammogram and DXA up to date.  If patient incapacitated, then would have her husband designated.  Hep C screening - completed prev.  Meds, vitals, and allergies reviewed.   PMH and SH reviewed  ROS: Per HPI unless specifically indicated in ROS section   GEN: nad, alert and oriented HEENT: ncat NECK: supple w/o LA CV: rrr. PULM: ctab, no inc wob ABD: soft, +bs EXT: no edema SKIN: no acute rash

## 2020-02-20 NOTE — Patient Instructions (Signed)
Use the celebrex as needed, keep moving, and try to slowly taper your estrogen.   Take care.  Glad to see you.

## 2020-02-20 NOTE — Telephone Encounter (Signed)
Sent. Thanks.   

## 2020-02-21 NOTE — Assessment & Plan Note (Signed)
Edema controlled with HCTZ.    Still on HCTZ with K dur.   Continue as is.  Continue work on diet and exercise.  Labs discussed with patient.

## 2020-02-21 NOTE — Assessment & Plan Note (Signed)
If patient incapacitated, then would have her husband designated.

## 2020-02-21 NOTE — Assessment & Plan Note (Signed)
Likely related to her back.  She does not have weakness or foot drop now.  She improved with twice daily Celebrex and was able to taper back down to daily dosing.  Would continue as is.  Previous back surgeries discussed with patient.  She will update me as needed.  She agrees to plan.

## 2020-02-21 NOTE — Assessment & Plan Note (Signed)
Flu vaccine - done yearly.  Tetanus done 2018 at pharmacy 10/14/16 PNA vaccine - up to date.   Shingles d/w pt. Encouraged. covid vaccine 2021 Colonoscopy 2019 Mammogram and DXA up to date.  If patient incapacitated, then would have her husband designated.  Hep C screening - completed prev.

## 2020-02-21 NOTE — Assessment & Plan Note (Signed)
She is tapering HRT, down to 1/2 tab daily.  Discussed slow taper.  She may be able to cut out 1/2 tablet/week and then do that for period of time before removing another half tablet.

## 2020-04-21 DIAGNOSIS — Z23 Encounter for immunization: Secondary | ICD-10-CM | POA: Diagnosis not present

## 2020-05-09 DIAGNOSIS — Z23 Encounter for immunization: Secondary | ICD-10-CM | POA: Diagnosis not present

## 2020-06-04 DIAGNOSIS — H524 Presbyopia: Secondary | ICD-10-CM | POA: Diagnosis not present

## 2020-06-04 DIAGNOSIS — H35033 Hypertensive retinopathy, bilateral: Secondary | ICD-10-CM | POA: Diagnosis not present

## 2020-06-04 DIAGNOSIS — H2513 Age-related nuclear cataract, bilateral: Secondary | ICD-10-CM | POA: Diagnosis not present

## 2020-07-08 ENCOUNTER — Encounter: Payer: Self-pay | Admitting: Family Medicine

## 2020-07-08 DIAGNOSIS — Z20822 Contact with and (suspected) exposure to covid-19: Secondary | ICD-10-CM | POA: Diagnosis not present

## 2020-07-08 DIAGNOSIS — R051 Acute cough: Secondary | ICD-10-CM | POA: Diagnosis not present

## 2020-07-08 DIAGNOSIS — H109 Unspecified conjunctivitis: Secondary | ICD-10-CM | POA: Diagnosis not present

## 2020-07-08 DIAGNOSIS — R058 Other specified cough: Secondary | ICD-10-CM | POA: Diagnosis not present

## 2020-07-09 ENCOUNTER — Other Ambulatory Visit: Payer: Self-pay | Admitting: Family Medicine

## 2020-07-09 MED ORDER — POLYMYXIN B-TRIMETHOPRIM 10000-0.1 UNIT/ML-% OP SOLN
1.0000 [drp] | Freq: Four times a day (QID) | OPHTHALMIC | 0 refills | Status: DC
Start: 2020-07-09 — End: 2021-03-13

## 2020-07-09 MED ORDER — BENZONATATE 200 MG PO CAPS
200.0000 mg | ORAL_CAPSULE | Freq: Three times a day (TID) | ORAL | 1 refills | Status: DC | PRN
Start: 2020-07-09 — End: 2021-03-13

## 2020-11-26 ENCOUNTER — Encounter: Payer: Self-pay | Admitting: Family Medicine

## 2020-11-26 NOTE — Telephone Encounter (Signed)
I spoke with pt; pt is calling about her husband and transferring the pt message to Rolla Flatten chart DOB 03/14/1959.

## 2020-11-26 NOTE — Telephone Encounter (Signed)
Please call and triage patient from [place].

## 2020-12-05 LAB — HM MAMMOGRAPHY

## 2020-12-16 ENCOUNTER — Other Ambulatory Visit: Payer: Self-pay | Admitting: Family Medicine

## 2020-12-17 NOTE — Telephone Encounter (Signed)
Pt is due for [facility] after 02/19/21

## 2021-02-09 ENCOUNTER — Other Ambulatory Visit: Payer: Self-pay | Admitting: Family Medicine

## 2021-02-09 DIAGNOSIS — I700 Atherosclerosis of aorta: Secondary | ICD-10-CM

## 2021-02-09 DIAGNOSIS — R609 Edema, unspecified: Secondary | ICD-10-CM

## 2021-02-14 ENCOUNTER — Other Ambulatory Visit: Payer: Medicare Other

## 2021-02-18 ENCOUNTER — Other Ambulatory Visit: Payer: Medicare Other

## 2021-02-19 ENCOUNTER — Other Ambulatory Visit: Payer: Medicare Other

## 2021-02-21 ENCOUNTER — Encounter: Payer: Medicare Other | Admitting: Family Medicine

## 2021-02-25 ENCOUNTER — Encounter: Payer: Medicare Other | Admitting: Family Medicine

## 2021-03-10 ENCOUNTER — Other Ambulatory Visit (INDEPENDENT_AMBULATORY_CARE_PROVIDER_SITE_OTHER): Payer: Medicare Other

## 2021-03-10 ENCOUNTER — Other Ambulatory Visit: Payer: Self-pay

## 2021-03-10 DIAGNOSIS — I700 Atherosclerosis of aorta: Secondary | ICD-10-CM | POA: Diagnosis not present

## 2021-03-10 DIAGNOSIS — R609 Edema, unspecified: Secondary | ICD-10-CM

## 2021-03-10 LAB — COMPREHENSIVE METABOLIC PANEL
ALT: 20 U/L (ref 0–35)
AST: 22 U/L (ref 0–37)
Albumin: 4.1 g/dL (ref 3.5–5.2)
Alkaline Phosphatase: 71 U/L (ref 39–117)
BUN: 13 mg/dL (ref 6–23)
CO2: 30 mEq/L (ref 19–32)
Calcium: 9.2 mg/dL (ref 8.4–10.5)
Chloride: 104 mEq/L (ref 96–112)
Creatinine, Ser: 0.69 mg/dL (ref 0.40–1.20)
GFR: 86.77 mL/min (ref >60.00)
Glucose, Bld: 85 mg/dL (ref 70–99)
Potassium: 3.9 mEq/L (ref 3.5–5.1)
Sodium: 141 mEq/L (ref 135–145)
Total Bilirubin: 1.5 mg/dL — ABNORMAL HIGH (ref 0.2–1.2)
Total Protein: 6.6 g/dL (ref 6.0–8.3)

## 2021-03-10 LAB — CBC WITH DIFFERENTIAL/PLATELET
Basophils Absolute: 0.1 10*3/uL (ref 0.0–0.1)
Basophils Relative: 0.8 % (ref 0.0–3.0)
Eosinophils Absolute: 0.2 10*3/uL (ref 0.0–0.7)
Eosinophils Relative: 3.3 % (ref 0.0–5.0)
HCT: 40.2 % (ref 36.0–46.0)
Hemoglobin: 13.6 g/dL (ref 12.0–15.0)
Lymphocytes Relative: 32.7 % (ref 12.0–46.0)
Lymphs Abs: 2.2 10*3/uL (ref 0.7–4.0)
MCHC: 33.9 g/dL (ref 30.0–36.0)
MCV: 91 fl (ref 78.0–100.0)
Monocytes Absolute: 0.6 10*3/uL (ref 0.1–1.0)
Monocytes Relative: 8.2 % (ref 3.0–12.0)
Neutro Abs: 3.8 10*3/uL (ref 1.4–7.7)
Neutrophils Relative %: 55 % (ref 43.0–77.0)
Platelets: 258 10*3/uL (ref 150.0–400.0)
RBC: 4.41 Mil/uL (ref 3.87–5.11)
RDW: 13.9 % (ref 11.5–15.5)
WBC: 6.8 10*3/uL (ref 4.0–10.5)

## 2021-03-10 LAB — LIPID PANEL
Cholesterol: 190 mg/dL (ref 0–200)
HDL: 64 mg/dL (ref >39.00)
LDL Cholesterol: 112 mg/dL — ABNORMAL HIGH (ref 0–99)
NonHDL: 126.03
Total CHOL/HDL Ratio: 3
Triglycerides: 71 mg/dL (ref 0.0–149.0)
VLDL: 14.2 mg/dL (ref 0.0–40.0)

## 2021-03-11 ENCOUNTER — Other Ambulatory Visit: Payer: Self-pay | Admitting: Family Medicine

## 2021-03-13 ENCOUNTER — Encounter: Payer: Self-pay | Admitting: Family Medicine

## 2021-03-13 ENCOUNTER — Other Ambulatory Visit: Payer: Self-pay

## 2021-03-13 ENCOUNTER — Ambulatory Visit (INDEPENDENT_AMBULATORY_CARE_PROVIDER_SITE_OTHER): Payer: Medicare Other | Admitting: Family Medicine

## 2021-03-13 VITALS — BP 106/84 | HR 78 | Temp 97.9°F | Ht 64.0 in | Wt 158.0 lb

## 2021-03-13 DIAGNOSIS — Z0000 Encounter for general adult medical examination without abnormal findings: Secondary | ICD-10-CM

## 2021-03-13 DIAGNOSIS — R609 Edema, unspecified: Secondary | ICD-10-CM

## 2021-03-13 DIAGNOSIS — Z79890 Hormone replacement therapy (postmenopausal): Secondary | ICD-10-CM | POA: Diagnosis not present

## 2021-03-13 DIAGNOSIS — M549 Dorsalgia, unspecified: Secondary | ICD-10-CM

## 2021-03-13 DIAGNOSIS — Z7189 Other specified counseling: Secondary | ICD-10-CM

## 2021-03-13 DIAGNOSIS — G8929 Other chronic pain: Secondary | ICD-10-CM

## 2021-03-13 MED ORDER — TIZANIDINE HCL 4 MG PO TABS: 2.0000 mg | ORAL_TABLET | Freq: Every evening | ORAL | 3 refills | Status: DC | PRN

## 2021-03-13 MED ORDER — ESTRADIOL 1 MG PO TABS: 0.5000 mg | ORAL_TABLET | Freq: Every day | ORAL | 3 refills | Status: DC

## 2021-03-13 MED ORDER — HYDROCHLOROTHIAZIDE 25 MG PO TABS: 25.0000 mg | ORAL_TABLET | Freq: Every day | ORAL | 3 refills | Status: DC

## 2021-03-13 MED ORDER — POTASSIUM CHLORIDE ER 10 MEQ PO TBCR
10.0000 meq | EXTENDED_RELEASE_TABLET | Freq: Every day | ORAL | 3 refills | Status: DC
Start: 2021-03-13 — End: 2022-02-13

## 2021-03-13 MED ORDER — CELECOXIB 100 MG PO CAPS: 100.0000 mg | ORAL_CAPSULE | Freq: Two times a day (BID) | ORAL | 3 refills | Status: DC | PRN

## 2021-03-13 NOTE — Patient Instructions (Addendum)
Flu shot this fall.  Update me as needed.  Take care.  Glad to see you. Taper estrogen very slowly.

## 2021-03-13 NOTE — Progress Notes (Signed)
This visit occurred during the SARS-CoV-2 public health emergency.  Safety protocols were in place, including screening questions prior to the visit, additional usage of staff PPE, and extensive cleaning of exam room while observing appropriate contact time as indicated for disinfecting solutions.  I have personally reviewed the Medicare Annual Wellness questionnaire and have noted 1. The patient's medical and social history 2. Their use of alcohol, tobacco or illicit drugs 3. Their current medications and supplements 4. The patient's functional ability including ADL's, fall risks, home safety risks and hearing or visual             impairment. 5. Diet and physical activities 6. Evidence for depression or mood disorders  The patients weight, height, BMI have been recorded in the chart and visual acuity is per eye clinic.  I have made referrals, counseling and provided education to the patient based review of the above and I have provided the pt with a written personalized care plan for preventive services.  Provider list updated- see scanned forms.  Routine anticipatory guidance given to patient.  See health maintenance. The possibility exists that previously documented standard health maintenance information may have been brought forward from a previous encounter into this note.  If needed, that same information has been updated to reflect the current situation based on today's encounter.    Flu to be done fall 2022 Shingles discussed with pt PNA not applicable given her history/allergy Tetanus 2018 COVID-vaccine up-to-date Colonoscopy 2019 Breast cancer screening 2022 Bone density test 2021 Advance directive-husband designated if patient were incapacitated. Cognitive function addressed- see scanned forms- and if abnormal then additional documentation follows.   In addition to [facility] Wellness, follow up visit for the below conditions:  Using celebrex usually daily, rarely BID.  Mobic  didn't help as much.    She tapered HRT down to 1/2 tab a day.  She failed full taper but is back down to 1/2 tab 5 days a week.   Edema controlled with HCTZ.  Labs discussed with patient.  No CP, SOB.  Compliant.  PMH and SH reviewed  Meds, vitals, and allergies reviewed.   ROS: Per HPI.  Unless specifically indicated otherwise in HPI, the patient denies:  General: fever. Eyes: acute vision changes ENT: sore throat Cardiovascular: chest pain Respiratory: SOB GI: vomiting GU: dysuria Musculoskeletal: acute back pain Derm: acute rash Neuro: acute motor dysfunction Psych: worsening mood Endocrine: polydipsia Heme: bleeding Allergy: hayfever  GEN: nad, alert and oriented HEENT: ncat NECK: supple w/o LA CV: rrr. PULM: ctab, no inc wob ABD: soft, +bs EXT: no edema SKIN: Well-perfused.

## 2021-03-17 DIAGNOSIS — Z0000 Encounter for general adult medical examination without abnormal findings: Secondary | ICD-10-CM | POA: Insufficient documentation

## 2021-03-17 NOTE — Assessment & Plan Note (Signed)
She is able to manage as is and it is reasonable to continue Celebrex as is.  She can update me as needed.

## 2021-03-17 NOTE — Assessment & Plan Note (Signed)
Advance directive- husband designated if patient were incapacitated.  

## 2021-03-17 NOTE — Assessment & Plan Note (Signed)
She tapered HRT down to 1/2 tab a day.  She failed full taper but is back down to 1/2 tab 5 days a week.  Discussed with patient about very slow taper as tolerated.  She can update me as needed.

## 2021-03-17 NOTE — Assessment & Plan Note (Signed)
Flu to be done fall 2022 Shingles discussed with pt PNA not applicable given her history/allergy Tetanus 2018 COVID-vaccine up-to-date Colonoscopy 2019 Breast cancer screening 2022 Bone density test 2021 Advance directive-husband designated if patient were incapacitated. Cognitive function addressed- see scanned forms- and if abnormal then additional documentation follows.

## 2021-03-17 NOTE — Assessment & Plan Note (Signed)
Controlled with hydrochlorothiazide.  Would continue as is.

## 2021-04-16 ENCOUNTER — Encounter: Payer: Self-pay | Admitting: Family Medicine

## 2021-04-26 ENCOUNTER — Encounter: Payer: Self-pay | Admitting: Family Medicine

## 2021-04-27 ENCOUNTER — Encounter: Payer: Self-pay | Admitting: Family Medicine

## 2021-07-23 ENCOUNTER — Encounter: Payer: Self-pay | Admitting: Family Medicine

## 2021-07-25 ENCOUNTER — Other Ambulatory Visit: Payer: Self-pay | Admitting: Family Medicine

## 2021-07-25 MED ORDER — ONDANSETRON HCL 4 MG PO TABS: 4.0000 mg | ORAL_TABLET | Freq: Three times a day (TID) | ORAL | 0 refills | Status: DC | PRN

## 2021-12-11 LAB — HM MAMMOGRAPHY

## 2022-01-16 ENCOUNTER — Other Ambulatory Visit: Payer: Self-pay | Admitting: Family Medicine

## 2022-02-11 ENCOUNTER — Other Ambulatory Visit: Payer: Self-pay | Admitting: Family Medicine

## 2022-02-12 NOTE — Telephone Encounter (Signed)
Patient needs medicare wellness scheduled for after 03/13/22. Please call to schedule

## 2022-02-13 NOTE — Telephone Encounter (Signed)
Patient is going to call back to schedule.

## 2022-02-16 ENCOUNTER — Encounter: Payer: Self-pay | Admitting: Family

## 2022-02-16 ENCOUNTER — Ambulatory Visit (INDEPENDENT_AMBULATORY_CARE_PROVIDER_SITE_OTHER): Payer: Medicare Other | Admitting: Family

## 2022-02-16 ENCOUNTER — Ambulatory Visit
Admission: RE | Admit: 2022-02-16 | Discharge: 2022-02-16 | Disposition: A | Discharge status: Home / Self Care | Payer: Medicare Other | Source: Ambulatory Visit | Attending: Family | Admitting: Family

## 2022-02-16 ENCOUNTER — Other Ambulatory Visit: Payer: Self-pay | Admitting: Family Medicine

## 2022-02-16 VITALS — BP 118/76 | HR 67 | Temp 98.9°F | Resp 16 | Ht 64.0 in | Wt 158.0 lb

## 2022-02-16 DIAGNOSIS — R1032 Left lower quadrant pain: Secondary | ICD-10-CM | POA: Diagnosis not present

## 2022-02-16 DIAGNOSIS — K5790 Diverticulosis of intestine, part unspecified, without perforation or abscess without bleeding: Secondary | ICD-10-CM | POA: Insufficient documentation

## 2022-02-16 DIAGNOSIS — R1031 Right lower quadrant pain: Secondary | ICD-10-CM | POA: Insufficient documentation

## 2022-02-16 DIAGNOSIS — N3941 Urge incontinence: Secondary | ICD-10-CM | POA: Diagnosis not present

## 2022-02-16 LAB — CBC WITH DIFFERENTIAL/PLATELET
Absolute Monocytes: 436 cells/uL (ref 200–950)
Basophils Absolute: 33 cells/uL (ref 0–200)
Basophils Relative: 0.5 %
Eosinophils Absolute: 238 cells/uL (ref 15–500)
Eosinophils Relative: 3.6 %
HCT: 38.2 % (ref 35.0–45.0)
Hemoglobin: 13.1 g/dL (ref 11.7–15.5)
Lymphs Abs: 2363 cells/uL (ref 850–3900)
MCH: 31 pg (ref 27.0–33.0)
MCHC: 34.3 g/dL (ref 32.0–36.0)
MCV: 90.5 fL (ref 80.0–100.0)
MPV: 10.1 fL (ref 7.5–12.5)
Monocytes Relative: 6.6 %
Neutro Abs: 3531 cells/uL (ref 1500–7800)
Neutrophils Relative %: 53.5 %
Platelets: 253 10*3/uL (ref 140–400)
RBC: 4.22 10*6/uL (ref 3.80–5.10)
RDW: 13.9 % (ref 11.0–15.0)
Total Lymphocyte: 35.8 %
WBC: 6.6 10*3/uL (ref 3.8–10.8)

## 2022-02-16 LAB — COMPREHENSIVE METABOLIC PANEL
AG Ratio: 1.7 (calc) (ref 1.0–2.5)
ALT: 20 U/L (ref 6–29)
AST: 22 U/L (ref 10–35)
Albumin: 4 g/dL (ref 3.6–5.1)
Alkaline phosphatase (APISO): 76 U/L (ref 37–153)
BUN: 11 mg/dL (ref 7–25)
CO2: 30 mmol/L (ref 20–32)
Calcium: 9.3 mg/dL (ref 8.6–10.4)
Chloride: 105 mmol/L (ref 98–110)
Creat: 0.65 mg/dL (ref 0.60–1.00)
Globulin: 2.3 g/dL (calc) (ref 1.9–3.7)
Glucose, Bld: 87 mg/dL (ref 65–99)
Potassium: 4.1 mmol/L (ref 3.5–5.3)
Sodium: 141 mmol/L (ref 135–146)
Total Bilirubin: 1.2 mg/dL (ref 0.2–1.2)
Total Protein: 6.3 g/dL (ref 6.1–8.1)

## 2022-02-16 LAB — POC URINALSYSI DIPSTICK (AUTOMATED)
Bilirubin, UA: NEGATIVE
Blood, UA: NEGATIVE
Glucose, UA: NEGATIVE
Ketones, UA: NEGATIVE
Leukocytes, UA: NEGATIVE
Nitrite, UA: NEGATIVE
Protein, UA: NEGATIVE
Spec Grav, UA: 1.01 (ref 1.010–1.025)
Urobilinogen, UA: 0.2 E.U./dL
pH, UA: 7.5 (ref 5.0–8.0)

## 2022-02-16 LAB — POCT I-STAT CREATININE: Creatinine, Ser: 0.6 mg/dL (ref 0.44–1.00)

## 2022-02-16 MED ORDER — CELECOXIB 100 MG PO CAPS
100.0000 mg | ORAL_CAPSULE | Freq: Every day | ORAL | 1 refills | Status: AC
Start: 2022-02-16 — End: ?

## 2022-02-16 MED ORDER — IOHEXOL 300 MG/ML  SOLN
100.0000 mL | Freq: Once | INTRAMUSCULAR | Status: AC | PRN
  Administered 2022-02-16: 100 mL via INTRAVENOUS

## 2022-02-16 NOTE — Assessment & Plan Note (Signed)
Referred feels pain in right side with palpation of left lower  Pending results of CT

## 2022-02-16 NOTE — Assessment & Plan Note (Signed)
ongoing seeing urology however with lower abd pain will order urine as well

## 2022-02-16 NOTE — Patient Instructions (Signed)
Stat CT today  Stat labs today Pending results If any worsening pain please let me know.    Regards,   Eugenia Pancoast FNP-C

## 2022-02-16 NOTE — Assessment & Plan Note (Addendum)
Ordering ct abd pelvis to r/o diverticulitis as h/o sigmoid colon diverticulitis in the past  Cbc cmp today stat also pending results If any exacerbation or worsening pain go to er and or call 911.   Less likely kidney stone however will assess for this  If diverticulitis and without red flag concerns will treat outpatient

## 2022-02-16 NOTE — Progress Notes (Signed)
Established Patient Office Visit  Subjective:  Patient ID: Stacie Huff, female    DOB: June 13, 1951  Age: 71 y.o. MRN: [id number]  CC:  Chief Complaint  Patient presents with  . Abdominal Pain    Right side and goes to back X 2 weeks. Over time it has got more frequency and more intense.    HPI Stacie Huff is here today with concerns.   Over the last two weeks twinges of pain.  However in the last few days, frequency of pain and twinges have increased.   Worse with standing up and or fixing dinner, this is when it really hits her.  Last night pain was more intense. 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  Sx include rlq abdominal pain, does not have appendix. Also right ovary removed in the past. No trouble voiding. Bowel movements normal. Rlq abd pain does radiate to right lower back.  No left lower abdominal quadrant pain. Has had diverticulitis in the past and always on the right lower side. H/o cholecystectomy   Past Medical History:  Diagnosis Date  . Arthritis   . History of postmenopausal HRT   . Urine, incontinence, stress female     Past Surgical History:  Procedure Laterality Date  . APPENDECTOMY  1960  . BLADDER REPAIR  1999  . BLADDER REPAIR  2004  . BREAST ENHANCEMENT SURGERY  1982  . CHOLECYSTECTOMY  1991  . LAMINECTOMY  3 total.  1990 x1, 2000 x2  . OOPHORECTOMY Right   . PARTIAL HYSTERECTOMY  1982  . TONSILLECTOMY AND ADENOIDECTOMY  1956    Family History  Problem Relation Age of Onset  . Sarcoidosis Mother   . Colon cancer Father        at age 80  . Breast cancer Neg Hx   . Esophageal cancer Neg Hx   . Rectal cancer Neg Hx   . Stomach cancer Neg Hx     Social History   Socioeconomic History  . Marital status: Married    Spouse name: Not on file  . Number of children: Not on file  . Years of education: Not on file  . Highest education level: Not on file  Occupational History  . Not on file  Tobacco Use  . Smoking  status: Never  . Smokeless tobacco: Never  Vaping Use  . Vaping Use: Never used  Substance and Sexual Activity  . Alcohol use: No    Alcohol/week: 0.0 standard drinks of alcohol  . Drug use: No  . Sexual activity: Not Currently  Other Topics Concern  . 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 Difficulty of Paying Living Expenses: Not hard at all  Food Insecurity: No Food Insecurity (02/16/2020)   Hunger Vital Sign   . Worried About Charity fundraiser in the Last Year: Never true   . Ran Out of Food in the Last Year: Never true  Transportation Needs: No Transportation Needs (02/16/2020)   PRAPARE - Transportation   . Lack of Transportation (Medical): No   . Lack of Transportation (Non-Medical): No  Physical Activity: Sufficiently Active (02/16/2020)   Exercise Vital Sign   . Days of Exercise per Week:  7 days   . Minutes of Exercise per Session: 60 min  Stress: No Stress Concern Present (02/16/2020)   [place]   . Feeling of Stress : Not at all  Social Connections: Not on file  Intimate Partner Violence: Not At Risk (02/16/2020)   Humiliation, Afraid, Rape, and Kick questionnaire   . Fear of Current or Ex-Partner: No   . Emotionally Abused: No   . Physically Abused: No   . Sexually Abused: No    Outpatient Medications Prior to Visit  Medication Sig Dispense Refill  . acetaminophen (TYLENOL) 500 MG tablet Take 2 tablets (1,000 mg total) by mouth at bedtime.    . Calcium Carb-Cholecalciferol (CALCIUM 600/VITAMIN D3 PO) Take by mouth daily.    Marland Kitchen estradiol (ESTRACE) 1 MG tablet Take 0.5-1 tablets (0.5-1 mg total) by mouth daily. 90 tablet 3  . hydrochlorothiazide  (HYDRODIURIL) 25 MG tablet TAKE 1 TABLET BY MOUTH  DAILY 90 tablet 3  . ondansetron (ZOFRAN) 4 MG tablet Take 1 tablet (4 mg total) by mouth every 8 (eight) hours as needed for nausea or vomiting. 20 tablet 0  . OVER THE COUNTER MEDICATION daily. Fish oil 1200, omega 3- 600 mg, with vitamin d 3- 2000 units    . potassium chloride (KLOR-CON) 10 MEQ tablet TAKE 1 TABLET BY MOUTH  DAILY 90 tablet 0  . celecoxib (CELEBREX) 100 MG capsule Take 1 capsule (100 mg total) by mouth 2 (two) times daily as needed. (Patient taking differently: Take 100 mg by mouth daily.) 180 capsule 3  . oxybutynin (DITROPAN-XL) 5 MG 24 hr tablet Take 5 mg by mouth daily.    Marland Kitchen tiZANidine (ZANAFLEX) 4 MG tablet Take 0.5-1 tablets (2-4 mg total) by mouth at bedtime as needed for muscle spasms. (Patient not taking: Reported on 02/16/2022) 90 tablet 3   No facility-administered medications prior to visit.    Allergies  Allergen Reactions  . Demerol [Meperidine] Nausea And Vomiting  . Gabapentin Other (See Comments)    Sedation  . Pneumococcal Vaccines Other (See Comments)    Local reaction but would avoid in the future  . Vicodin [Hydrocodone-Acetaminophen] Itching        Objective:    Physical Exam Constitutional:      General: She is not in acute distress.    Appearance: Normal appearance. She is well-developed and normal weight. She is not ill-appearing, toxic-appearing or diaphoretic.  HENT:     Right Ear: Tympanic membrane normal.     Left Ear: Tympanic membrane normal.     Nose: Nose normal. No congestion or rhinorrhea.     Right Turbinates: Not enlarged or swollen.     Left Turbinates: Not enlarged or swollen.     Right Sinus: No maxillary sinus tenderness or frontal sinus tenderness.     Left Sinus: No maxillary sinus tenderness or frontal sinus tenderness.     Mouth/Throat:     Pharynx: No pharyngeal swelling, oropharyngeal exudate or posterior oropharyngeal erythema.     Tonsils: No tonsillar exudate.   Eyes:     Conjunctiva/sclera: Conjunctivae normal.  Neck:     Thyroid: No thyroid mass.  Cardiovascular:     Rate and Rhythm: Normal rate and regular rhythm.  Pulmonary:     Effort: Pulmonary effort is normal.     Breath sounds: Normal breath sounds.  Abdominal:     General: Bowel sounds are normal.     Palpations: Abdomen is soft.  There is no mass.     Tenderness: There is abdominal tenderness in the right lower quadrant.     Hernia: No hernia is present.     Comments: Referred pain from llq to rlq on palpation  Tenderness calcification vs mass vs scar tissue tenderness rlq  Lymphadenopathy:     Cervical:     Right cervical: No superficial cervical adenopathy.    Left cervical: No superficial cervical adenopathy.  Neurological:     Mental Status: She is alert.    BP 118/76   Pulse 67   Temp 98.9 F (37.2 C)   Resp 16   Ht '5\' 4"'$  (1.626 m)   Wt 158 lb (71.7 kg)   SpO2 98%   BMI 27.12 kg/m  Wt Readings from Last 3 Encounters:  02/16/22 158 lb (71.7 kg)  03/13/21 158 lb (71.7 kg)  02/20/20 150 lb 3 oz (68.1 kg)     Health Maintenance Due  Topic Date Due  . Zoster Vaccines- Shingrix (1 of 2) Never done  . COVID-19 Vaccine (4 - Moderna series) 07/04/2020  . INFLUENZA VACCINE  02/10/2022    There are no preventive care reminders to display for this patient.  Lab Results  Component Value Date   TSH 1.46 10/14/2016   Lab Results  Component Value Date   WBC 6.8 03/10/2021   HGB 13.6 03/10/2021   HCT 40.2 03/10/2021   MCV 91.0 03/10/2021   PLT 258.0 03/10/2021   Lab Results  Component Value Date   NA 141 03/10/2021   K 3.9 03/10/2021   CO2 30 03/10/2021   GLUCOSE 85 03/10/2021   BUN 13 03/10/2021   CREATININE 0.69 03/10/2021   BILITOT 1.5 (H) 03/10/2021   ALKPHOS 71 03/10/2021   AST 22 03/10/2021   ALT 20 03/10/2021   PROT 6.6 03/10/2021   ALBUMIN 4.1 03/10/2021   CALCIUM 9.2 03/10/2021   GFR 86.77 03/10/2021   No results found for: "HGBA1C"     Assessment & Plan:   Problem List Items Addressed This Visit       Digestive   Diverticulosis   Relevant Orders   CBC with Differential   Comprehensive metabolic panel     Other   LLQ pain - Primary    Referred feels pain in right side with palpation of left lower  Pending results of CT       Relevant Orders   POCT Urinalysis Dipstick (Automated)   Urine Culture   CBC with Differential   Comprehensive metabolic panel   Urge incontinence    ongoing seeing urology however with lower abd pain will order urine as well       Relevant Medications   oxybutynin (DITROPAN-XL) 5 MG 24 hr tablet   Other Relevant Orders   POCT Urinalysis Dipstick (Automated)   Urine Culture   Right lower quadrant abdominal pain    Ordering ct abd pelvis to r/o diverticulitis as h/o sigmoid colon diverticulitis in the past  Cbc cmp today stat also pending results If any exacerbation or worsening pain go to er and or call 911.   Less likely kidney stone however will assess for this  If diverticulitis and without red flag concerns will treat outpatient      Relevant Orders   CT Abdomen Pelvis W Contrast   CBC with Differential   Comprehensive metabolic panel    Meds ordered this encounter  Medications  . celecoxib (CELEBREX) 100 MG capsule    Sig: Take  1 capsule (100 mg total) by mouth daily.    Dispense:  90 capsule    Refill:  1    Order Specific Question:   Supervising Provider    Answer:   BEDSOLE, AMY E [[id number]]    Follow-up: Return if symptoms worsen or fail to improve with pcp.    Eugenia Pancoast, FNP

## 2022-02-17 LAB — URINE CULTURE
MICRO NUMBER:: 13744756
Result:: NO GROWTH
SPECIMEN QUALITY:: ADEQUATE

## 2022-02-18 ENCOUNTER — Encounter: Payer: Self-pay | Admitting: Family Medicine

## 2022-02-18 ENCOUNTER — Encounter: Payer: Self-pay | Admitting: Family

## 2022-02-18 NOTE — Progress Notes (Signed)
Labs unremarkable.  Negative urine culture CT without acute findings.  Suspect this is likely muscular in nature.  Has she tried a muscle relaxer? I suggest she also apply heat to site as well.   FYI

## 2022-02-23 ENCOUNTER — Encounter: Payer: Self-pay | Admitting: Family

## 2022-02-23 ENCOUNTER — Telehealth: Payer: Self-pay

## 2022-02-23 ENCOUNTER — Ambulatory Visit (INDEPENDENT_AMBULATORY_CARE_PROVIDER_SITE_OTHER): Payer: Medicare Other | Admitting: Family

## 2022-02-23 VITALS — BP 122/76 | HR 67 | Temp 98.6°F | Resp 16 | Ht 64.0 in | Wt 158.0 lb

## 2022-02-23 DIAGNOSIS — R112 Nausea with vomiting, unspecified: Secondary | ICD-10-CM | POA: Diagnosis not present

## 2022-02-23 DIAGNOSIS — R109 Unspecified abdominal pain: Secondary | ICD-10-CM

## 2022-02-23 DIAGNOSIS — R1031 Right lower quadrant pain: Secondary | ICD-10-CM | POA: Diagnosis not present

## 2022-02-23 DIAGNOSIS — R195 Other fecal abnormalities: Secondary | ICD-10-CM | POA: Insufficient documentation

## 2022-02-23 DIAGNOSIS — G250 Essential tremor: Secondary | ICD-10-CM

## 2022-02-23 DIAGNOSIS — N281 Cyst of kidney, acquired: Secondary | ICD-10-CM | POA: Diagnosis not present

## 2022-02-23 DIAGNOSIS — K5790 Diverticulosis of intestine, part unspecified, without perforation or abscess without bleeding: Secondary | ICD-10-CM | POA: Diagnosis not present

## 2022-02-23 NOTE — Progress Notes (Signed)
Established Patient Office Visit  Subjective:  Patient ID: Stacie Huff, female    DOB: 21-Dec-1950  Age: 71 y.o. MRN: [id number]  CC:  Chief Complaint  Patient presents with   Abdominal Pain    Have increased. Today is a ache    HPI Stacie Huff is here today with concerns.   Worsening abdominal pain.  When lying down is much worse.  Today feels sore lower right side, constant ache.  After standing for 15-20 minutes feels something 'twist' inside like a kink.   Did have diarrhea after contrast but has since resolved.  Is not overly gassy.   Taking prilosec without any relief.  Taking robaxin, twice daily, with only mild relief.   Has vomited several times since last visit, last time vomiting was three times. Vomited from the pain. Felt a twisting sensation.   Labs 8/7 urine culture negative for UTI.  Cbc unremarkable.  Cmp unremarkable.  Is eating ok, no decreased appetite.   Did try bentyl last week, without much relief.  Not overly nausea, only at times.   Some right flank pain, stomach rlq radiates to right flank at times.  6 cm kidney cyst on ct abd   Past Medical History:  Diagnosis Date   Arthritis    History of postmenopausal HRT    Urine, incontinence, stress female     Past Surgical History:  Procedure Laterality Date   [place]   BLADDER REPAIR  2004   BREAST ENHANCEMENT [place]  3 total.  1990 x1, 2000 x2   OOPHORECTOMY Right    PARTIAL HYSTERECTOMY  1982   TONSILLECTOMY AND ADENOIDECTOMY  1956    Family History  Problem Relation Age of Onset   Sarcoidosis Mother    Colon cancer Father        at age 80   Breast cancer Neg Hx    Esophageal cancer Neg Hx    Rectal cancer Neg Hx    Stomach cancer Neg Hx     Social History   Socioeconomic History   Marital status: Married    Spouse name: Not on file   Number of children: Not on file   Years of education: Not on  file   Highest education level: Not on file  Occupational History   Not on file  Tobacco Use   Smoking status: Never   Smokeless tobacco: Never  Vaping Use   Vaping Use: Never used  Substance and Sexual Activity   Alcohol use: No    Alcohol/week: 0.0 standard drinks of alcohol   Drug use: No   Sexual activity: Not Currently  Other Topics Concern   Not on file  Social History Narrative   Married 2015   Retired Hospital doctor, 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   Enjoys water skiing   Social Determinants of Health   Financial Resource Strain: Low Risk  (02/16/2020)   Overall Financial Resource Strain (CARDIA)    Difficulty of Paying Living Expenses: Not hard at all  [place]: No [place] (02/16/2020)   Hunger Vital Sign    Worried About Running Out of Food in the Last Year: Never true    [place] in the Last Year: Never true  Transportation Needs: No Transportation Needs (02/16/2020)   PRAPARE - Hydrologist (Medical): No  Lack of Transportation (Non-Medical): No  Physical Activity: Sufficiently Active (02/16/2020)   Exercise Vital Sign    Days of Exercise per Week: 7 days    Minutes of Exercise per Session: 60 min  Stress: No Stress Concern Present (02/16/2020)   [place]    Feeling of Stress : Not at all  Social Connections: Not on file  Intimate Partner Violence: Not At Risk (02/16/2020)   Humiliation, Afraid, Rape, and Kick questionnaire    Fear of Current or Ex-Partner: No    Emotionally Abused: No    Physically Abused: No    Sexually Abused: No    Outpatient Medications Prior to Visit  Medication Sig Dispense Refill   acetaminophen (TYLENOL) 500 MG tablet Take 2 tablets (1,000 mg total) by mouth at bedtime.     Calcium Carb-Cholecalciferol (CALCIUM 600/VITAMIN D3 PO) Take by mouth daily.     celecoxib (CELEBREX) 100 MG capsule  Take 1 capsule (100 mg total) by mouth daily. 90 capsule 1   estradiol (ESTRACE) 1 MG tablet Take 0.5-1 tablets (0.5-1 mg total) by mouth daily. 90 tablet 3   hydrochlorothiazide (HYDRODIURIL) 25 MG tablet TAKE 1 TABLET BY MOUTH  DAILY 90 tablet 3   methocarbamol (ROBAXIN) 500 MG tablet Take 500 mg by mouth 4 (four) times daily.     omeprazole (PRILOSEC OTC) 20 MG tablet Take 20 mg by mouth daily.     ondansetron (ZOFRAN) 4 MG tablet Take 1 tablet (4 mg total) by mouth every 8 (eight) hours as needed for nausea or vomiting. 20 tablet 0   OVER THE COUNTER MEDICATION daily. Fish oil 1200, omega 3- 600 mg, with vitamin d 3- 2000 units     potassium chloride (KLOR-CON) 10 MEQ tablet TAKE 1 TABLET BY MOUTH  DAILY 90 tablet 0   oxybutynin (DITROPAN-XL) 5 MG 24 hr tablet Take 5 mg by mouth daily. (Patient not taking: Reported on 02/23/2022)     No facility-administered medications prior to visit.    Allergies  Allergen Reactions   Demerol [Meperidine] Nausea And Vomiting   Gabapentin Other (See Comments)    Sedation   Pneumococcal Vaccines Other (See Comments)    Local reaction but would avoid in the future   Vicodin [Hydrocodone-Acetaminophen] Itching        Objective:    Physical Exam Constitutional:      General: She is not in acute distress.    Appearance: Normal appearance. She is well-developed, well-groomed and normal weight. She is not ill-appearing, toxic-appearing or diaphoretic.  HENT:     Mouth/Throat:     Pharynx: No pharyngeal swelling.     Tonsils: No tonsillar exudate.  Neck:     Thyroid: No thyroid mass.  Pulmonary:     Effort: Pulmonary effort is normal.  Abdominal:     General: Bowel sounds are decreased.     Tenderness: There is abdominal tenderness in the right lower quadrant. Negative signs include Murphy's sign, McBurney's sign and psoas sign.     Hernia: No hernia is present. There is no hernia in the umbilical area.     Comments: Right lower quadrant with  area of possible scarring vs calcification, slight tenderness on palpation   Musculoskeletal:     Lumbar back: Normal. No tenderness.     Comments: No flank pain no CVA tenderness  Lymphadenopathy:     Cervical:     Right cervical: No superficial cervical adenopathy.  Left cervical: No superficial cervical adenopathy.  Neurological:     General: No focal deficit present.     Mental Status: She is alert and oriented to person, place, and time. Mental status is at baseline.  Psychiatric:        Mood and Affect: Mood normal.        Behavior: Behavior normal.        Thought Content: Thought content normal.        Judgment: Judgment normal.     BP 122/76   Pulse 67   Temp 98.6 F (37 C)   Resp 16   Ht '5\' 4"'$  (1.626 m)   Wt 158 lb (71.7 kg)   SpO2 97%   BMI 27.12 kg/m  Wt Readings from Last 3 Encounters:  02/23/22 158 lb (71.7 kg)  02/16/22 158 lb (71.7 kg)  03/13/21 158 lb (71.7 kg)     Health Maintenance Due  Topic Date Due   Zoster Vaccines- Shingrix (1 of 2) Never done   COVID-19 Vaccine (4 - Moderna series) 07/04/2020   INFLUENZA VACCINE  02/10/2022    There are no preventive care reminders to display for this patient.  Lab Results  Component Value Date   TSH 1.46 10/14/2016   Lab Results  Component Value Date   WBC 6.6 02/16/2022   HGB 13.1 02/16/2022   HCT 38.2 02/16/2022   MCV 90.5 02/16/2022   PLT 253 02/16/2022   Lab Results  Component Value Date   NA 141 02/16/2022   K 4.1 02/16/2022   CO2 30 02/16/2022   GLUCOSE 87 02/16/2022   BUN 11 02/16/2022   CREATININE 0.60 02/16/2022   BILITOT 1.2 02/16/2022   ALKPHOS 71 03/10/2021   AST 22 02/16/2022   ALT 20 02/16/2022   PROT 6.3 02/16/2022   ALBUMIN 4.1 03/10/2021   CALCIUM 9.3 02/16/2022   GFR 86.77 03/10/2021   No results found for: "HGBA1C"    Assessment & Plan:   Problem List Items Addressed This Visit       Digestive   Diverticulosis   Relevant Orders   Ambulatory referral to  Gastroenterology   Nausea and vomiting    zofran 4 mg prn  Bland diet as tolerated      Relevant Orders   Ambulatory referral to Gastroenterology     Nervous and Auditory   Essential tremor    Including clicking of teeth involuntary Eval/treat        Relevant Orders   Ambulatory referral to Neurology     Genitourinary   Renal cyst, right    Ordering US renal for closer look and to again r/o kidney stone.       Relevant Orders   US RENAL (Completed)     Other   Right lower quadrant abdominal pain - Primary    Ordering US renal  fobt unremarkable CT abd pelvis for acute findings for llq abd pain so referring to GI for possible colonoscopy if necessary      Relevant Orders   Ambulatory referral to Gastroenterology   Fecal occult blood, imunochemical (Completed)   Flank pain    Ordering US renal        Relevant Orders   US RENAL (Completed)   Dark stools    Ordering fobt pending results      Relevant Orders   Fecal occult blood, imunochemical (Completed)    No orders of the defined types were placed in this encounter.   Follow-up: Return  in about 1 week (around 03/02/2022) for with Dr. Damita Dunnings.    Eugenia Pancoast, FNP

## 2022-02-23 NOTE — Telephone Encounter (Signed)
Called patient she has been scheduled to see Stacie Huff today at 3:20. No further action needed.

## 2022-02-23 NOTE — Patient Instructions (Addendum)
Sending you home for an FOBT  Please get this done and complete stat.   A referral was placed today for neurology.  Please let us know if you have not heard back within 2 weeks about the referral.  Due to recent changes in healthcare laws, you may see results of your imaging and/or laboratory studies on MyChart before I have had a chance to review them.  I understand that in some cases there may be results that are confusing or concerning to you. Please understand that not all results are received at the same time and often I may need to interpret multiple results in order to provide you with the best plan of care or course of treatment. Therefore, I ask that you please give me 2 business days to thoroughly review all your results before contacting my office for clarification. Should we see a critical lab result, you will be contacted sooner.   It was a pleasure seeing you today! Please do not hesitate to reach out with any questions and or concerns.  Regards,   Eugenia Pancoast FNP-C

## 2022-02-24 ENCOUNTER — Ambulatory Visit (HOSPITAL_COMMUNITY)
Admission: RE | Admit: 2022-02-24 | Discharge: 2022-02-24 | Disposition: A | Discharge status: Home / Self Care | Payer: Medicare Other | Source: Ambulatory Visit | Attending: Family | Admitting: Family

## 2022-02-24 DIAGNOSIS — R109 Unspecified abdominal pain: Secondary | ICD-10-CM | POA: Diagnosis present

## 2022-02-24 DIAGNOSIS — N281 Cyst of kidney, acquired: Secondary | ICD-10-CM | POA: Diagnosis present

## 2022-02-25 ENCOUNTER — Other Ambulatory Visit (INDEPENDENT_AMBULATORY_CARE_PROVIDER_SITE_OTHER): Payer: Medicare Other

## 2022-02-25 DIAGNOSIS — R1031 Right lower quadrant pain: Secondary | ICD-10-CM

## 2022-02-25 DIAGNOSIS — R195 Other fecal abnormalities: Secondary | ICD-10-CM

## 2022-02-25 LAB — FECAL OCCULT BLOOD, IMMUNOCHEMICAL: Fecal Occult Bld: NEGATIVE

## 2022-03-02 ENCOUNTER — Encounter: Payer: Self-pay | Admitting: Family

## 2022-03-02 NOTE — Assessment & Plan Note (Addendum)
Including clicking of teeth involuntary Eval/treat

## 2022-03-02 NOTE — Assessment & Plan Note (Signed)
Ordering US renal for closer look and to again r/o kidney stone.

## 2022-03-02 NOTE — Assessment & Plan Note (Signed)
Ordering US renal  fobt unremarkable CT abd pelvis for acute findings for llq abd pain so referring to GI for possible colonoscopy if necessary

## 2022-03-02 NOTE — Assessment & Plan Note (Signed)
Ordering fobt pending results

## 2022-03-02 NOTE — Assessment & Plan Note (Signed)
zofran 4 mg prn  Bland diet as tolerated

## 2022-03-02 NOTE — Assessment & Plan Note (Signed)
Ordering US renal

## 2022-03-11 ENCOUNTER — Telehealth: Payer: Self-pay | Admitting: Family Medicine

## 2022-03-11 NOTE — Telephone Encounter (Signed)
Left message for patient to call back and schedule Medicare Annual Wellness Visit (AWV).   Please offer to do virtually or by telephone.   Last AWV:   03/13/2021  Please schedule at anytime with [facility] Advisor schedule   45 minute appointent  If any questions, please contact me at [phone]

## 2022-03-26 ENCOUNTER — Ambulatory Visit: Payer: Medicare Other | Admitting: Physician Assistant

## 2022-04-01 ENCOUNTER — Ambulatory Visit: Payer: Medicare Other | Admitting: Physician Assistant

## 2022-04-07 ENCOUNTER — Ambulatory Visit (INDEPENDENT_AMBULATORY_CARE_PROVIDER_SITE_OTHER): Payer: Medicare Other | Admitting: Family Medicine

## 2022-04-07 ENCOUNTER — Encounter: Payer: Self-pay | Admitting: Family Medicine

## 2022-04-07 VITALS — BP 122/82 | HR 80 | Temp 97.4°F | Ht 64.0 in | Wt 163.0 lb

## 2022-04-07 DIAGNOSIS — M4800 Spinal stenosis, site unspecified: Secondary | ICD-10-CM

## 2022-04-07 DIAGNOSIS — Z23 Encounter for immunization: Secondary | ICD-10-CM | POA: Diagnosis not present

## 2022-04-07 DIAGNOSIS — Z0000 Encounter for general adult medical examination without abnormal findings: Secondary | ICD-10-CM

## 2022-04-07 DIAGNOSIS — Z79890 Hormone replacement therapy (postmenopausal): Secondary | ICD-10-CM

## 2022-04-07 MED ORDER — ESTRADIOL 1 MG PO TABS: 0.5000 mg | ORAL_TABLET | Freq: Every day | ORAL | Status: DC

## 2022-04-07 MED ORDER — PREDNISONE 20 MG PO TABS: ORAL_TABLET | ORAL | 0 refills | Status: DC

## 2022-04-07 MED ORDER — POTASSIUM CHLORIDE ER 10 MEQ PO TBCR: 10.0000 meq | EXTENDED_RELEASE_TABLET | Freq: Every day | ORAL | 3 refills | Status: DC

## 2022-04-07 NOTE — Patient Instructions (Signed)
Hold celebrex, start prednisone, and we'll work on the referral.   Take care.  Glad to see you.

## 2022-04-07 NOTE — Progress Notes (Unsigned)
I have personally reviewed the Medicare Annual Wellness questionnaire and have noted 1. The patient's medical and social history 2. Their use of alcohol, tobacco or illicit drugs 3. Their current medications and supplements 4. The patient's functional ability including ADL's, fall risks, home safety risks and hearing or visual             impairment. 5. Diet and physical activities 6. Evidence for depression or mood disorders  The patients weight, height, BMI have been recorded in the chart and visual acuity is per eye clinic.  I have made referrals, counseling and provided education to the patient based review of the above and I have provided the pt with a written personalized care plan for preventive services.  Provider list updated- see scanned forms.  Routine anticipatory guidance given to patient.  See health maintenance. The possibility exists that previously documented standard health maintenance information may have been brought forward from a previous encounter into this note.  If needed, that same information has been updated to reflect the current situation based on today's encounter.    Flu 2023 Shingles discussed with patient PNA previously done Tetanus 2018 COVID-vaccine previously done Colonoscopy 2019  Breast cancer screening 2023 Advance directive discussed with patient.  Husband designated if patient were incapacitated. Cognitive function addressed- see scanned forms- and if abnormal then additional documentation follows.   In addition to [facility] Wellness, follow up visit for the below conditions:  RLQ pain.  Worse with standing.  She was able to hike and did well, out at [facility].  Occ twinges but not pain at the time.  Was able to walk in [place] but had sig pain during that trip.  Taking robaxin occ, that helps a little.  No radicular pain, no leg pain.  Can radiate around to the R lower back.  No L sided sx.  Doesn't cross the midline.  Laying down helps  the pain, more than sitting.    =================================================== Prev CT with  There is no evidence of intestinal obstruction or pneumoperitoneum. There is no hydronephrosis.   Diverticulosis of colon without signs of diverticulitis. Fatty liver. Status post cholecystectomy.   Small hiatal hernia. Simple appearing renal cysts. Marked lumbar spondylosis with spinal stenosis and encroachment of neural foramina at multiple levels. ===================================================  She is back on daily HRT per outside clinic with the plan to taper over to cream later on.  Per Dr. Lucia Gaskins with GYN clinic.    PMH and SH reviewed  Meds, vitals, and allergies reviewed.   ROS: Per HPI.  Unless specifically indicated otherwise in HPI, the patient denies:  General: fever. Eyes: acute vision changes ENT: sore throat Cardiovascular: chest pain Respiratory: SOB GI: vomiting GU: dysuria Musculoskeletal: acute back pain Derm: acute rash Neuro: acute motor dysfunction Psych: worsening mood Endocrine: polydipsia Heme: bleeding Allergy: hayfever  GEN: nad, alert and oriented HEENT: ncat NECK: supple w/o LA CV: rrr. PULM: ctab, no inc wob ABD: soft, +bs EXT: no edema SKIN: no acute rash Normal hip range of motion bilaterally.  Straight leg raise negative.  Abdomen not tender.  No paresthesia on the right side of the abdomen.

## 2022-04-08 DIAGNOSIS — M4800 Spinal stenosis, site unspecified: Secondary | ICD-10-CM | POA: Insufficient documentation

## 2022-04-08 NOTE — Assessment & Plan Note (Signed)
She is back on daily HRT per outside clinic with the plan to taper over to cream later on.  Per Dr. Lucia Gaskins with GYN clinic.  I will defer.  She agrees.

## 2022-04-08 NOTE — Assessment & Plan Note (Signed)
Flu 2023 Shingles discussed with patient PNA previously done Tetanus 2018 COVID-vaccine previously done Colonoscopy 2019  Breast cancer screening 2023 Advance directive discussed with patient.  Husband designated if patient were incapacitated. Cognitive function addressed- see scanned forms- and if abnormal then additional documentation follows.

## 2022-04-09 NOTE — Assessment & Plan Note (Signed)
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  I think it makes sense to see neurosurgery, hold Celebrex, and start prednisone routine steroid cautions in the meantime.  She agrees to plan.  Referral placed.

## 2022-04-13 ENCOUNTER — Telehealth: Payer: Self-pay | Admitting: Family Medicine

## 2022-04-13 DIAGNOSIS — M4800 Spinal stenosis, site unspecified: Secondary | ICD-10-CM

## 2022-04-13 NOTE — Telephone Encounter (Signed)
Patient called in and stated she will need to have a MRI done before her neuro appointment. Once she has a MRI scheduled she will be able to get scheduled with neuro surgeon sooner. She will need a MRI of lumber spine and a xray. Thank you!

## 2022-04-14 NOTE — Telephone Encounter (Signed)
MRI ordered.  Please check with patient to see where she wants to get this done.  Thanks.

## 2022-04-14 NOTE — Addendum Note (Signed)
Addended by: Tonia Ghent on: 04/14/2022 08:06 AM   Modules accepted: Orders

## 2022-04-14 NOTE — Telephone Encounter (Signed)
Can the order location be changed to [place]?

## 2022-04-14 NOTE — Telephone Encounter (Signed)
Patient called in returning call she received. She stated she can go to [place] but not this week. She stated the prednisone has done wonders.

## 2022-04-14 NOTE — Telephone Encounter (Signed)
LMTCB

## 2022-04-15 NOTE — Telephone Encounter (Signed)
I changed it to [facility].  Thanks.

## 2022-04-15 NOTE — Addendum Note (Signed)
Addended by: Tonia Ghent on: 04/15/2022 11:48 AM   Modules accepted: Orders

## 2022-04-23 ENCOUNTER — Ambulatory Visit
Admission: RE | Admit: 2022-04-23 | Discharge: 2022-04-23 | Disposition: A | Discharge status: Home / Self Care | Payer: Medicare Other | Source: Ambulatory Visit | Attending: Family Medicine | Admitting: Family Medicine

## 2022-04-23 DIAGNOSIS — M4800 Spinal stenosis, site unspecified: Secondary | ICD-10-CM

## 2022-05-06 ENCOUNTER — Encounter: Payer: Self-pay | Admitting: Family Medicine

## 2022-12-17 ENCOUNTER — Encounter: Payer: Self-pay | Admitting: Family Medicine

## 2022-12-24 ENCOUNTER — Other Ambulatory Visit: Payer: Self-pay | Admitting: Family Medicine

## 2022-12-24 LAB — HM DEXA SCAN: HM Dexa Scan: UNDETERMINED

## 2022-12-25 ENCOUNTER — Encounter: Payer: Self-pay | Admitting: Family Medicine

## 2023-02-24 ENCOUNTER — Other Ambulatory Visit: Payer: Self-pay | Admitting: Family Medicine

## 2023-03-19 ENCOUNTER — Other Ambulatory Visit: Payer: Self-pay | Admitting: Family Medicine

## 2023-04-04 ENCOUNTER — Other Ambulatory Visit: Payer: Self-pay | Admitting: Family Medicine

## 2023-04-04 DIAGNOSIS — G8929 Other chronic pain: Secondary | ICD-10-CM

## 2023-04-04 DIAGNOSIS — I700 Atherosclerosis of aorta: Secondary | ICD-10-CM

## 2023-04-08 ENCOUNTER — Other Ambulatory Visit: Payer: Medicare Other

## 2023-04-08 DIAGNOSIS — M549 Dorsalgia, unspecified: Secondary | ICD-10-CM | POA: Diagnosis not present

## 2023-04-08 DIAGNOSIS — I700 Atherosclerosis of aorta: Secondary | ICD-10-CM | POA: Diagnosis not present

## 2023-04-08 DIAGNOSIS — G8929 Other chronic pain: Secondary | ICD-10-CM

## 2023-04-08 LAB — CBC WITH DIFFERENTIAL/PLATELET
Basophils Absolute: 0 10*3/uL (ref 0.0–0.1)
Basophils Relative: 0.6 % (ref 0.0–3.0)
Eosinophils Absolute: 0.2 10*3/uL (ref 0.0–0.7)
Eosinophils Relative: 2.6 % (ref 0.0–5.0)
HCT: 39 % (ref 36.0–46.0)
Hemoglobin: 13 g/dL (ref 12.0–15.0)
Lymphocytes Relative: 44.4 % (ref 12.0–46.0)
Lymphs Abs: 2.7 10*3/uL (ref 0.7–4.0)
MCHC: 33.4 g/dL (ref 30.0–36.0)
MCV: 91.2 fl (ref 78.0–100.0)
Monocytes Absolute: 0.5 10*3/uL (ref 0.1–1.0)
Monocytes Relative: 7.9 % (ref 3.0–12.0)
Neutro Abs: 2.7 10*3/uL (ref 1.4–7.7)
Neutrophils Relative %: 44.5 % (ref 43.0–77.0)
Platelets: 242 10*3/uL (ref 150.0–400.0)
RBC: 4.28 Mil/uL (ref 3.87–5.11)
RDW: 13.7 % (ref 11.5–15.5)
WBC: 6.1 10*3/uL (ref 4.0–10.5)

## 2023-04-08 LAB — LIPID PANEL
Cholesterol: 198 mg/dL (ref 0–200)
HDL: 68.6 mg/dL (ref >39.00)
LDL Cholesterol: 117 mg/dL — ABNORMAL HIGH (ref 0–99)
NonHDL: 129.5
Total CHOL/HDL Ratio: 3
Triglycerides: 65 mg/dL (ref 0.0–149.0)
VLDL: 13 mg/dL (ref 0.0–40.0)

## 2023-04-08 LAB — COMPREHENSIVE METABOLIC PANEL
ALT: 11 U/L (ref 0–35)
AST: 17 U/L (ref 0–37)
Albumin: 3.9 g/dL (ref 3.5–5.2)
Alkaline Phosphatase: 65 U/L (ref 39–117)
BUN: 13 mg/dL (ref 6–23)
CO2: 31 mEq/L (ref 19–32)
Calcium: 9.2 mg/dL (ref 8.4–10.5)
Chloride: 106 mEq/L (ref 96–112)
Creatinine, Ser: 0.69 mg/dL (ref 0.40–1.20)
GFR: 85.51 mL/min (ref >60.00)
Glucose, Bld: 80 mg/dL (ref 70–99)
Potassium: 3.8 mEq/L (ref 3.5–5.1)
Sodium: 142 mEq/L (ref 135–145)
Total Bilirubin: 1.4 mg/dL — ABNORMAL HIGH (ref 0.2–1.2)
Total Protein: 6.4 g/dL (ref 6.0–8.3)

## 2023-04-12 ENCOUNTER — Ambulatory Visit (INDEPENDENT_AMBULATORY_CARE_PROVIDER_SITE_OTHER): Payer: Medicare Other

## 2023-04-12 VITALS — Ht 63.0 in | Wt 138.0 lb

## 2023-04-12 DIAGNOSIS — Z0000 Encounter for general adult medical examination without abnormal findings: Secondary | ICD-10-CM

## 2023-04-12 NOTE — Patient Instructions (Signed)
[name] , Thank you for taking time to come for your Medicare Wellness Visit. I appreciate your ongoing commitment to your health goals. Please review the following plan we discussed and let me know if I can assist you in the future.   Referrals/Orders/Follow-Ups/Clinician Recommendations: Aim for 30 minutes of exercise or brisk walking, 6-8 glasses of water, and 5 servings of fruits and vegetables each day.   This is a list of the screening recommended for you and due dates:  Health Maintenance  Topic Date Due   Zoster (Shingles) Vaccine (1 of 2) Never done   Mammogram  12/12/2022   Flu Shot  02/11/2023   COVID-19 Vaccine (4 - 2023-24 season) 03/14/2023   Medicare Annual Wellness Visit  04/11/2024   DTaP/Tdap/Td vaccine (2 - Td or Tdap) 10/15/2026   Colon Cancer Screening  02/23/2028   Pneumonia Vaccine  Completed   DEXA scan (bone density measurement)  Completed   Hepatitis C Screening  Completed   HPV Vaccine  Aged Out    Advanced directives: (In Chart) A copy of your advanced directives are scanned into your chart should your provider ever need it.  Next Medicare Annual Wellness Visit scheduled for next year: Yes  Insert Preventive Care attachment Insert FALL PREVENTION attachment if needed

## 2023-04-12 NOTE — Progress Notes (Signed)
Subjective:   Stacie Huff is a 72 y.o. female who presents for Medicare Annual (Subsequent) preventive examination.  Visit Complete: Virtual  I connected with  Stacie Huff on 04/12/23 by a audio enabled telemedicine application and verified that I am speaking with the correct person using two identifiers.  Patient Location: Home  Provider Location: Home Office  I discussed the limitations of evaluation and management by telemedicine. The patient expressed understanding and agreed to proceed.  Patient Medicare AWV questionnaire was completed by the patient on 04/12/2023; I have confirmed that all information answered by patient is correct and no changes since this date.  Cardiac Risk Factors include: advanced age (>68men, >66 women)Because this visit was a virtual/telehealth visit, some criteria may be missing or patient reported. Any vitals not documented were not able to be obtained and vitals that have been documented are patient reported.       Objective:    Today's Vitals   04/12/23 1120  Weight: 138 lb (62.6 kg)  Height: 5\' 3"  (1.6 m)   Body mass index is 24.45 kg/m.     04/12/2023   11:25 AM 02/16/2020    8:27 AM 12/12/2018   11:12 AM 12/08/2017   11:57 AM 10/14/2016   11:33 AM  Advanced Directives  Does Patient Have a Medical Advance Directive? Yes Yes Yes No No  Type of Estate agent of [place];Living will Healthcare Power of [place];Living will Healthcare Power of [place];Living will    Does patient want to make changes to medical advance directive? No - Patient declined      Copy of Healthcare Power of Attorney in Chart? Yes - validated most recent copy scanned in chart (See row information) Yes - validated most recent copy scanned in chart (See row information) No - copy requested    Would patient like information on creating a medical advance directive?    No - Patient declined     Current Medications (verified) Outpatient Encounter Medications  as of 04/12/2023  Medication Sig   Calcium Carb-Cholecalciferol (CALCIUM 600/VITAMIN D3 PO) Take by mouth daily.   celecoxib (CELEBREX) 100 MG capsule Take 1 capsule (100 mg total) by mouth daily.   estradiol (ESTRACE) 1 MG tablet Take 0.5 tablets (0.5 mg total) by mouth daily.   hydrochlorothiazide (HYDRODIURIL) 25 MG tablet TAKE 1 TABLET BY MOUTH DAILY   OVER THE COUNTER MEDICATION daily. Fish oil 1200, omega 3- 600 mg, with vitamin d 3- 2000 units   oxyBUTYnin (DITROPAN) 5 MG/5ML solution    potassium chloride (KLOR-CON) 10 MEQ tablet TAKE 1 TABLET BY MOUTH DAILY   methocarbamol (ROBAXIN) 500 MG tablet Take 500 mg by mouth 4 (four) times daily.   predniSONE (DELTASONE) 20 MG tablet Take 2 a day for 5 days, then 1 a day for 5 days, with food. Don't take with aleve/ibuprofen/celebrex.   No facility-administered encounter medications on file as of 04/12/2023.    Allergies (verified) Demerol [meperidine], Gabapentin, Pneumococcal vaccines, and Vicodin [hydrocodone-acetaminophen]   History: Past Medical History:  Diagnosis Date   Arthritis    History of postmenopausal HRT    Urine, incontinence, stress female    Past Surgical History:  Procedure Laterality Date   APPENDECTOMY  1960   BLADDER REPAIR  1999   BLADDER REPAIR  2004   BREAST ENHANCEMENT SURGERY  1982   CHOLECYSTECTOMY  1991   LAMINECTOMY  3 total.  1990 x1, 2000 x2   OOPHORECTOMY Right    PARTIAL HYSTERECTOMY  1982   TONSILLECTOMY AND ADENOIDECTOMY  1956   Family History  Problem Relation Age of Onset   Sarcoidosis Mother    Colon cancer Father        at age 58   Breast cancer Neg Hx    Esophageal cancer Neg Hx    Rectal cancer Neg Hx    Stomach cancer Neg Hx    Social History   Socioeconomic History   Marital status: Married    Spouse name: Not on file   Number of children: Not on file   Years of education: Not on file   Highest education level: Not on file  Occupational History   Not on file  Tobacco  Use   Smoking status: Never   Smokeless tobacco: Never  Vaping Use   Vaping status: Never Used  Substance and Sexual Activity   Alcohol use: No    Alcohol/week: 0.0 standard drinks of alcohol   Drug use: No   Sexual activity: Not Currently  Other Topics Concern   Not on file  Social History Narrative   Married 2015   Retired Engineer, building services, 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   Enjoys water skiing   Social Determinants of Health   Financial Resource Strain: Low Risk  (04/12/2023)   Overall Financial Resource Strain (CARDIA)    Difficulty of Paying Living Expenses: Not hard at all  Food Insecurity: No Food Insecurity (04/12/2023)   Hunger Vital Sign    Worried About Running Out of Food in the Last Year: Never true    Ran Out of Food in the Last Year: Never true  Transportation Needs: No Transportation Needs (04/12/2023)   PRAPARE - Administrator, Civil Service (Medical): No    Lack of Transportation (Non-Medical): No  Physical Activity: Insufficiently Active (04/12/2023)   Exercise Vital Sign    Days of Exercise per Week: 4 days    Minutes of Exercise per Session: 30 min  Stress: No Stress Concern Present (04/12/2023)   [employer] of Occupational Health - Occupational Stress Questionnaire    Feeling of Stress : Not at all  Social Connections: Socially Integrated (04/12/2023)   Social Connection and Isolation Panel [NHANES]    Frequency of Communication with Friends and Family: More than three times a week    Frequency of Social Gatherings with Friends and Family: More than three times a week    Attends Religious Services: More than 4 times per year    Active Member of [employer] or Organizations: Yes    Attends Engineer, structural: More than 4 times per year    Marital Status: Married    Tobacco Counseling Counseling given: Not Answered   Clinical Intake:  Pre-visit preparation completed: Yes  Pain : No/denies pain      Nutritional Risks: None Diabetes: No  How often do you need to have someone help you when you read instructions, pamphlets, or other written materials from your doctor or pharmacy?: 1 - Never  Interpreter Needed?: No  Information entered by :: Renie Ora, LPN   Activities of Daily Living    04/12/2023   11:25 AM 04/08/2023    1:13 PM  In your present state of health, do you have any difficulty performing the following activities:  Hearing? 0 0  Vision? 0 0  Difficulty concentrating or making decisions? 0 0  Walking or climbing stairs? 0 0  Dressing or bathing? 0 0  Doing errands, shopping?  0 0  Preparing Food and eating ? N N  Using the Toilet? N N  In the past six months, have you accidently leaked urine? N Y  Do you have problems with loss of bowel control? N N  Managing your Medications? N N  Managing your Finances? N N  Housekeeping or managing your Housekeeping? N N    Patient Care Team: Joaquim Nam, MD as PCP - General (Family Medicine)  Indicate any recent Medical Services you may have received from other than [facility] providers in the past year (date may be approximate).     Assessment:   This is a routine wellness examination for [place].  Hearing/Vision screen Vision Screening - Comments:: Wears rx glasses - up to date with routine eye exams with  Dr.Gould    Goals Addressed             This Visit's Progress    Increase physical activity   On track    Starting 12/12/2018, I will continue to exercise for 60-100 minutes daily.        Depression Screen    04/12/2023   11:24 AM 04/07/2022    2:07 PM 03/13/2021    3:41 PM 02/16/2020    8:31 AM 02/14/2019    2:24 PM 12/12/2018   11:12 AM 12/08/2017   11:57 AM  PHQ 2/9 Scores  PHQ - 2 Score 0 0 0 0 0 0 0  PHQ- 9 Score    0  0 0    Fall Risk    04/12/2023   11:21 AM 04/08/2023    1:13 PM 04/07/2022    2:07 PM 03/13/2021    3:37 PM 02/16/2020    8:28 AM  Fall Risk   Falls in the past year? 0 0 0 0 0   Number falls in past yr: 0 0 0 0 0  Injury with Fall? 0  0 0 0  Risk for fall due to : No Fall Risks  No Fall Risks  No Fall Risks  Follow up Falls prevention discussed  Falls evaluation completed Falls evaluation completed Falls evaluation completed;Falls prevention discussed    MEDICARE RISK AT HOME: Medicare Risk at Home Any stairs in or around the home?: Yes If so, are there any without handrails?: No Home free of loose throw rugs in walkways, pet beds, electrical cords, etc?: Yes Adequate lighting in your home to reduce risk of falls?: Yes Life alert?: No Use of a cane, walker or w/c?: No Grab bars in the bathroom?: Yes Shower chair or bench in shower?: Yes Elevated toilet seat or a handicapped toilet?: Yes  TIMED UP AND GO:  Was the test performed?  No    Cognitive Function:    02/16/2020    8:37 AM 12/12/2018   11:12 AM 12/08/2017   11:57 AM 10/14/2016   11:33 AM  MMSE - Mini Mental State Exam  Orientation to time 5 5 5 5   Orientation to Place 5 5 5 5   Registration 3 3 3 3   Attention/ Calculation 5 0 0 0  Recall 3 3 3 3   Language- name 2 objects  0 0 0  Language- repeat 1 1 1 1   Language- follow 3 step command  0 3 3  Language- read & follow direction  0 0 0  Write a sentence  0 0 0  Copy design  0 0 0  Total score  17 20 20         04/12/2023  11:28 AM  6CIT Screen  What Year? 0 points  What month? 0 points  What time? 0 points  Count back from 20 0 points  Months in reverse 0 points  Repeat phrase 0 points  Total Score 0 points    Immunizations Immunization History  Administered Date(s) Administered   Fluad Quad(high Dose 65+) 04/07/2022   Influenza, High Dose Seasonal PF 04/05/2017, 04/17/2019   Influenza,inj,Quad PF,6+ Mos 04/19/2018   Influenza-Unspecified 04/12/2016, 04/05/2017, 04/19/2018   Meningococcal polysaccharide vaccine (MPSV4) 11/11/2012   Moderna Sars-Covid-2 Vaccination 07/21/2019, 08/18/2019, 05/09/2020   Pneumococcal Conjugate-13  10/14/2016   Pneumococcal Polysaccharide-23 12/08/2017   Tdap 10/14/2016   Yellow Fever 11/11/2012    TDAP status: Up to date  Flu Vaccine status: Up to date  Pneumococcal vaccine status: Up to date  Covid-19 vaccine status: Completed vaccines  Qualifies for Shingles Vaccine? Yes   Zostavax completed No   Shingrix Completed?: No.    Education has been provided regarding the importance of this vaccine. Patient has been advised to call insurance company to determine out of pocket expense if they have not yet received this vaccine. Advised may also receive vaccine at local pharmacy or Health Dept. Verbalized acceptance and understanding.  Screening Tests Health Maintenance  Topic Date Due   Zoster Vaccines- Shingrix (1 of 2) Never done   MAMMOGRAM  12/12/2022   INFLUENZA VACCINE  02/11/2023   COVID-19 Vaccine (4 - 2023-24 season) 03/14/2023   Medicare Annual Wellness (AWV)  04/11/2024   DTaP/Tdap/Td (2 - Td or Tdap) 10/15/2026   Colonoscopy  02/23/2028   Pneumonia Vaccine 88+ Years old  Completed   DEXA SCAN  Completed   Hepatitis C Screening  Completed   HPV VACCINES  Aged Out    Health Maintenance  Health Maintenance Due  Topic Date Due   Zoster Vaccines- Shingrix (1 of 2) Never done   MAMMOGRAM  12/12/2022   INFLUENZA VACCINE  02/11/2023   COVID-19 Vaccine (4 - 2023-24 season) 03/14/2023    Colorectal cancer screening: Type of screening: Colonoscopy. Completed 02/22/2018. Repeat every 10 years  Mammogram status: Completed 12/17/2022. Repeat every year  Bone Density status: Completed 12/24/2022. Results reflect: Bone density results: OSTEOPOROSIS. Repeat every 2 years.  Lung Cancer Screening: (Low Dose CT Chest recommended if Age 53-80 years, 20 pack-year currently smoking OR have quit w/in 15years.) does not qualify.   Lung Cancer Screening Referral: n/a  Additional Screening:  Hepatitis C Screening: does not qualify;  Vision Screening: Recommended annual  ophthalmology exams for early detection of glaucoma and other disorders of the eye. Is the patient up to date with their annual eye exam?  Yes  Who is the provider or what is the name of the office in which the patient attends annual eye exams? Dr. Emily Filbert  If pt is not established with a provider, would they like to be referred to a provider to establish care? No .   Dental Screening: Recommended annual dental exams for proper oral hygiene   Community Resource Referral / Chronic Care Management: CRR required this visit?  No   CCM required this visit?  No     Plan:     I have personally reviewed and noted the following in the patient's chart:   Medical and social history Use of alcohol, tobacco or illicit drugs  Current medications and supplements including opioid prescriptions. Patient is not currently taking opioid prescriptions. Functional ability and status Nutritional status Physical activity Advanced directives List of other  physicians Hospitalizations, surgeries, and ER visits in previous 12 months Vitals Screenings to include cognitive, depression, and falls Referrals and appointments  In addition, I have reviewed and discussed with patient certain preventive protocols, quality metrics, and best practice recommendations. A written personalized care plan for preventive services as well as general preventive health recommendations were provided to patient.     Lorrene Reid, LPN   10/19/8117   After Visit Summary: (MyChart) Due to this being a telephonic visit, the after visit summary with patients personalized plan was offered to patient via MyChart   Nurse Notes: none

## 2023-04-15 ENCOUNTER — Ambulatory Visit (INDEPENDENT_AMBULATORY_CARE_PROVIDER_SITE_OTHER): Payer: Medicare Other | Admitting: Family Medicine

## 2023-04-15 ENCOUNTER — Encounter: Payer: Self-pay | Admitting: Family Medicine

## 2023-04-15 VITALS — BP 122/80 | HR 77 | Temp 97.4°F | Ht 63.0 in | Wt 138.0 lb

## 2023-04-15 DIAGNOSIS — M549 Dorsalgia, unspecified: Secondary | ICD-10-CM

## 2023-04-15 DIAGNOSIS — Z23 Encounter for immunization: Secondary | ICD-10-CM | POA: Diagnosis not present

## 2023-04-15 DIAGNOSIS — R600 Localized edema: Secondary | ICD-10-CM | POA: Diagnosis not present

## 2023-04-15 DIAGNOSIS — G8929 Other chronic pain: Secondary | ICD-10-CM | POA: Diagnosis not present

## 2023-04-15 DIAGNOSIS — Z7189 Other specified counseling: Secondary | ICD-10-CM

## 2023-04-15 DIAGNOSIS — Z0000 Encounter for general adult medical examination without abnormal findings: Secondary | ICD-10-CM

## 2023-04-15 MED ORDER — HYDROCHLOROTHIAZIDE 25 MG PO TABS: 12.5000 mg | ORAL_TABLET | Freq: Every day | ORAL | Status: DC

## 2023-04-15 MED ORDER — CELECOXIB 100 MG PO CAPS: 100.0000 mg | ORAL_CAPSULE | Freq: Every day | ORAL | 3 refills | Status: DC

## 2023-04-15 NOTE — Progress Notes (Signed)
Intentional weight loss with weight watchers.  I thanked her for her effort.  Sometimes she had leg aches at night.  No claudication in the day.  Unclear if current dose of hydrochlorothiazide contributes, d/w pt. discussed that she could try taking 1/2 tab of hydrochlorothiazide per day.   Celebrex helped with joint pain.  Used daily.  No ADE on med.   D/w pt about HRT.  She saw GYN clinic in the meantime with the plan to continue estrogen tab with oxybutynin.  Both helped.    H/o BLE edema.  Still on hydrochlorothiazide w/o ADE on med. Some BLE edema at the end of the day.    Flu 2024 Shingles discussed with patient PNA previously done Tetanus 2018 COVID-vaccine previously done Colonoscopy 2019 Breast cancer screening 2024, d/w pt about options plastics f/u given h/o implant leak.  She doesn't have obvious sig sx related to this.   DXA 2024 Advance directive discussed with patient.  Husband designated if patient were incapacitated.  Meds, vitals, and allergies reviewed.   ROS: Per HPI unless specifically indicated in ROS section   GEN: nad, alert and oriented HEENT: ncat NECK: supple w/o LA CV: rrr. PULM: ctab, no inc wob ABD: soft, +bs EXT: no edema SKIN: Well-perfused.

## 2023-04-15 NOTE — Patient Instructions (Signed)
You could try taking 1/2 tab of hydrochlorothiazide and see how that does for swelling.   Take care.  Glad to see you.

## 2023-04-18 DIAGNOSIS — Z7189 Other specified counseling: Secondary | ICD-10-CM | POA: Insufficient documentation

## 2023-04-18 DIAGNOSIS — R600 Localized edema: Secondary | ICD-10-CM | POA: Insufficient documentation

## 2023-04-18 NOTE — Assessment & Plan Note (Signed)
Advance directive discussed with patient.  Husband designated if patient were incapacitated. 

## 2023-04-18 NOTE — Assessment & Plan Note (Signed)
Celebrex helped with joint pain.  Used daily.  No ADE on med.  Would continue as is.

## 2023-04-18 NOTE — Assessment & Plan Note (Signed)
H/o BLE edema.  Still on hydrochlorothiazide w/o ADE on med. Some BLE edema at the end of the day.  Unclear if her aches are related to hydrochlorothiazide use.  She can cut back to half tablet per day since she has intentional weight loss noted.  Discussed.  She can update me as needed.

## 2023-04-18 NOTE — Assessment & Plan Note (Signed)
Flu 2024 Shingles discussed with patient PNA previously done Tetanus 2018 COVID-vaccine previously done Colonoscopy 2019 Breast cancer screening 2024, d/w pt about options plastics f/u given h/o implant leak.  She doesn't have obvious sig sx related to this.   DXA 2024 Advance directive discussed with patient.  Husband designated if patient were incapacitated.

## 2023-05-20 ENCOUNTER — Other Ambulatory Visit: Payer: Self-pay | Admitting: Family Medicine

## 2023-05-21 NOTE — Telephone Encounter (Signed)
Pharmacy is requesting 90 day supply if ok.

## 2023-08-06 ENCOUNTER — Other Ambulatory Visit: Payer: Self-pay | Admitting: Family Medicine

## 2023-08-06 MED ORDER — OXYBUTYNIN CHLORIDE ER 5 MG PO TB24: 5.0000 mg | ORAL_TABLET | Freq: Every day | ORAL | 3 refills | Status: DC

## 2023-08-06 NOTE — Telephone Encounter (Signed)
Last office visit: 04/15/23 Next office visit: nothing scheduled Last refill:oxybutynin (DITROPAN-XL) 5 MG 24 hr tablet 04/15/23

## 2023-08-06 NOTE — Telephone Encounter (Signed)
Which pharmacy did she want to use?  Please verify and send.  Thanks.

## 2023-08-06 NOTE — Telephone Encounter (Signed)
Patient would like to use [facility]. I have added this pharmacy to her list.

## 2023-08-06 NOTE — Telephone Encounter (Signed)
Sent. Thanks.

## 2023-08-06 NOTE — Telephone Encounter (Deleted)
[facility]

## 2023-08-26 ENCOUNTER — Other Ambulatory Visit: Payer: Self-pay | Admitting: Family Medicine

## 2023-08-26 NOTE — Telephone Encounter (Signed)
Which pharmacy does she prefer?  Please send as is.  Thanks.

## 2023-08-26 NOTE — Telephone Encounter (Signed)
Last refill: estradiol (ESTRACE) 1 MG tablet 04/07/22  Last office visit: 04/15/23 Next office visit: nothing scheduled

## 2023-08-27 ENCOUNTER — Other Ambulatory Visit: Payer: Self-pay

## 2023-08-27 MED ORDER — ESTRADIOL 1 MG PO TABS
0.5000 mg | ORAL_TABLET | Freq: Every day | ORAL | 3 refills | Status: DC
  Filled 2023-08-27: qty 15, 30d supply, fill #0

## 2023-08-27 MED ORDER — ESTRADIOL 1 MG PO TABS: 0.5000 mg | ORAL_TABLET | Freq: Every day | ORAL | 3 refills | Status: DC

## 2023-12-20 LAB — HM MAMMOGRAPHY

## 2023-12-22 ENCOUNTER — Ambulatory Visit: Payer: Self-pay | Admitting: Family Medicine

## 2024-01-18 ENCOUNTER — Other Ambulatory Visit: Payer: Self-pay | Admitting: Family Medicine

## 2024-03-19 ENCOUNTER — Other Ambulatory Visit: Payer: Self-pay | Admitting: Family Medicine

## 2024-03-19 DIAGNOSIS — E785 Hyperlipidemia, unspecified: Secondary | ICD-10-CM

## 2024-03-30 ENCOUNTER — Other Ambulatory Visit (INDEPENDENT_AMBULATORY_CARE_PROVIDER_SITE_OTHER)

## 2024-03-30 DIAGNOSIS — E785 Hyperlipidemia, unspecified: Secondary | ICD-10-CM | POA: Diagnosis not present

## 2024-03-30 LAB — CBC WITH DIFFERENTIAL/PLATELET
Basophils Absolute: 0 K/uL (ref 0.0–0.1)
Basophils Relative: 0.7 % (ref 0.0–3.0)
Eosinophils Absolute: 0.2 K/uL (ref 0.0–0.7)
Eosinophils Relative: 3 % (ref 0.0–5.0)
HCT: 33.2 % — ABNORMAL LOW (ref 36.0–46.0)
Hemoglobin: 10.8 g/dL — ABNORMAL LOW (ref 12.0–15.0)
Lymphocytes Relative: 48.7 % — ABNORMAL HIGH (ref 12.0–46.0)
Lymphs Abs: 2.6 K/uL (ref 0.7–4.0)
MCHC: 32.5 g/dL (ref 30.0–36.0)
MCV: 78.1 fl (ref 78.0–100.0)
Monocytes Absolute: 0.5 K/uL (ref 0.1–1.0)
Monocytes Relative: 8.7 % (ref 3.0–12.0)
Neutro Abs: 2 K/uL (ref 1.4–7.7)
Neutrophils Relative %: 38.9 % — ABNORMAL LOW (ref 43.0–77.0)
Platelets: 237 K/uL (ref 150.0–400.0)
RBC: 4.24 Mil/uL (ref 3.87–5.11)
RDW: 16.5 % — ABNORMAL HIGH (ref 11.5–15.5)
WBC: 5.3 K/uL (ref 4.0–10.5)

## 2024-03-30 LAB — COMPREHENSIVE METABOLIC PANEL WITH GFR
ALT: 10 U/L (ref 0–35)
AST: 16 U/L (ref 0–37)
Albumin: 4 g/dL (ref 3.5–5.2)
Alkaline Phosphatase: 52 U/L (ref 39–117)
BUN: 14 mg/dL (ref 6–23)
CO2: 31 meq/L (ref 19–32)
Calcium: 9.1 mg/dL (ref 8.4–10.5)
Chloride: 105 meq/L (ref 96–112)
Creatinine, Ser: 0.71 mg/dL (ref 0.40–1.20)
GFR: 83.2 mL/min (ref >60.00)
Glucose, Bld: 89 mg/dL (ref 70–99)
Potassium: 3.9 meq/L (ref 3.5–5.1)
Sodium: 140 meq/L (ref 135–145)
Total Bilirubin: 1.3 mg/dL — ABNORMAL HIGH (ref 0.2–1.2)
Total Protein: 6.2 g/dL (ref 6.0–8.3)

## 2024-03-30 LAB — LIPID PANEL
Cholesterol: 190 mg/dL (ref 0–200)
HDL: 58.9 mg/dL (ref >39.00)
LDL Cholesterol: 120 mg/dL — ABNORMAL HIGH (ref 0–99)
NonHDL: 130.96
Total CHOL/HDL Ratio: 3
Triglycerides: 54 mg/dL (ref 0.0–149.0)
VLDL: 10.8 mg/dL (ref 0.0–40.0)

## 2024-04-02 ENCOUNTER — Ambulatory Visit: Payer: Self-pay | Admitting: Family Medicine

## 2024-04-06 ENCOUNTER — Encounter: Payer: Self-pay | Admitting: Family Medicine

## 2024-04-06 ENCOUNTER — Ambulatory Visit: Admitting: Family Medicine

## 2024-04-06 VITALS — BP 124/74 | HR 65 | Temp 98.5°F | Ht 62.68 in | Wt 149.0 lb

## 2024-04-06 DIAGNOSIS — R600 Localized edema: Secondary | ICD-10-CM | POA: Diagnosis not present

## 2024-04-06 DIAGNOSIS — G8929 Other chronic pain: Secondary | ICD-10-CM

## 2024-04-06 DIAGNOSIS — D649 Anemia, unspecified: Secondary | ICD-10-CM

## 2024-04-06 DIAGNOSIS — Z79890 Hormone replacement therapy (postmenopausal): Secondary | ICD-10-CM | POA: Diagnosis not present

## 2024-04-06 DIAGNOSIS — M549 Dorsalgia, unspecified: Secondary | ICD-10-CM

## 2024-04-06 DIAGNOSIS — Z0000 Encounter for general adult medical examination without abnormal findings: Secondary | ICD-10-CM

## 2024-04-06 DIAGNOSIS — Z7189 Other specified counseling: Secondary | ICD-10-CM

## 2024-04-06 LAB — CBC WITH DIFFERENTIAL/PLATELET
Basophils Absolute: 0 K/uL (ref 0.0–0.1)
Basophils Relative: 0.6 % (ref 0.0–3.0)
Eosinophils Absolute: 0.1 K/uL (ref 0.0–0.7)
Eosinophils Relative: 2.4 % (ref 0.0–5.0)
HCT: 34.7 % — ABNORMAL LOW (ref 36.0–46.0)
Hemoglobin: 11.2 g/dL — ABNORMAL LOW (ref 12.0–15.0)
Lymphocytes Relative: 49 % — ABNORMAL HIGH (ref 12.0–46.0)
Lymphs Abs: 2.7 K/uL (ref 0.7–4.0)
MCHC: 32.4 g/dL (ref 30.0–36.0)
MCV: 78.2 fl (ref 78.0–100.0)
Monocytes Absolute: 0.4 K/uL (ref 0.1–1.0)
Monocytes Relative: 6.6 % (ref 3.0–12.0)
Neutro Abs: 2.3 K/uL (ref 1.4–7.7)
Neutrophils Relative %: 41.4 % — ABNORMAL LOW (ref 43.0–77.0)
Platelets: 260 K/uL (ref 150.0–400.0)
RBC: 4.44 Mil/uL (ref 3.87–5.11)
RDW: 16.7 % — ABNORMAL HIGH (ref 11.5–15.5)
WBC: 5.5 K/uL (ref 4.0–10.5)

## 2024-04-06 LAB — VITAMIN B12: Vitamin B-12: 406 pg/mL (ref 211–911)

## 2024-04-06 LAB — IRON: Iron: 63 ug/dL (ref 42–145)

## 2024-04-06 LAB — FERRITIN: Ferritin: 9.5 ng/mL — ABNORMAL LOW (ref 10.0–291.0)

## 2024-04-06 MED ORDER — OXYBUTYNIN CHLORIDE ER 5 MG PO TB24: 5.0000 mg | ORAL_TABLET | Freq: Every day | ORAL | 3 refills | Status: AC

## 2024-04-06 MED ORDER — ESTRADIOL 1 MG PO TABS: 0.5000 mg | ORAL_TABLET | Freq: Every day | ORAL | 3 refills | Status: AC

## 2024-04-06 MED ORDER — POTASSIUM CHLORIDE ER 10 MEQ PO TBCR: 10.0000 meq | EXTENDED_RELEASE_TABLET | Freq: Every day | ORAL | 3 refills | Status: AC

## 2024-04-06 MED ORDER — HYDROCHLOROTHIAZIDE 25 MG PO TABS: 12.5000 mg | ORAL_TABLET | ORAL | 3 refills | Status: DC | PRN

## 2024-04-06 MED ORDER — OXYBUTYNIN CHLORIDE ER 5 MG PO TB24: 5.0000 mg | ORAL_TABLET | Freq: Every day | ORAL | Status: DC

## 2024-04-06 MED ORDER — CELECOXIB 100 MG PO CAPS: 100.0000 mg | ORAL_CAPSULE | Freq: Every day | ORAL | 3 refills | Status: AC

## 2024-04-06 NOTE — Patient Instructions (Addendum)
Go to the lab on the way out.   If you have mychart we'll likely use that to update you.    Take care.  Glad to see you. I would get a flu shot each fall.   

## 2024-04-06 NOTE — Progress Notes (Signed)
 Anemia. Labs d/w pt.  She had given blood twice relatively recently.  No other known blood loss.  Colonoscopy 2019.  Discussed with patient about lab evaluation today.  See notes on labs.  Celebrex  helped with joint pain.  Used daily.  No ADE on med.    D/w pt about HRT.  She saw GYN clinic prev with the plan to continue estrogen tab with oxybutynin .  Both helped.  S/p hysterectomy.     H/o BLE edema.  Still on hydrochlorothiazide  w/o ADE on med. Prev with some BLE edema at the end of the day.   No CP, SOB.    No FCNAVD.  She feels well o/w.   She had to cut back on exercise after a hamstring injury.  Labs d/w pt.  Labs likely affected by change in exercise.  R hamstring sore with prolonged standing.   Flu to be done at pharmacy.  RSV d/w pt.   Shingles discussed with patient PNA previously done Tetanus 2018 COVID-vaccine previously done Colonoscopy 2019 Breast cancer screening 2025 DXA 2024 Advance directive discussed with patient.  Husband designated if patient were incapacitated.  Meds, vitals, and allergies reviewed.    ROS: Per HPI unless specifically indicated in ROS section    GEN: nad, alert and oriented HEENT: ncat NECK: supple w/o LA CV: rrr. PULM: ctab, no inc wob ABD: soft, +bs EXT: no edema SKIN: Well-perfused.

## 2024-04-09 ENCOUNTER — Ambulatory Visit: Payer: Self-pay | Admitting: Family Medicine

## 2024-04-09 DIAGNOSIS — D649 Anemia, unspecified: Secondary | ICD-10-CM

## 2024-04-09 MED ORDER — IRON (FERROUS SULFATE) 325 (65 FE) MG PO TABS: 325.0000 mg | ORAL_TABLET | Freq: Every day | ORAL | Status: DC

## 2024-04-09 NOTE — Assessment & Plan Note (Signed)
 Continue Celebrex

## 2024-04-09 NOTE — Assessment & Plan Note (Signed)
 Continue estrogen as is, along with oxybutynin .  Risk and benefits discussed with patient.

## 2024-04-09 NOTE — Assessment & Plan Note (Signed)
 History of.  Continue hydrochlorothiazide .

## 2024-04-09 NOTE — Assessment & Plan Note (Signed)
Advance directive discussed with patient.  Husband designated if patient were incapacitated. 

## 2024-04-09 NOTE — Assessment & Plan Note (Signed)
 Flu to be done at pharmacy.  RSV d/w pt.   Shingles discussed with patient PNA previously done Tetanus 2018 COVID-vaccine previously done Colonoscopy 2019 Breast cancer screening 2025 DXA 2024 Advance directive discussed with patient.  Husband designated if patient were incapacitated.

## 2024-04-09 NOTE — Assessment & Plan Note (Addendum)
 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  Colonoscopy 2019.  See notes on labs.  Advised not to get blood in the meantime.

## 2024-05-25 ENCOUNTER — Other Ambulatory Visit: Payer: Self-pay | Admitting: Family Medicine

## 2024-05-25 MED ORDER — HYDROCHLOROTHIAZIDE 25 MG PO TABS: 12.5000 mg | ORAL_TABLET | ORAL | Status: DC | PRN

## 2024-05-25 MED ORDER — HYDROCHLOROTHIAZIDE 25 MG PO TABS: 12.5000 mg | ORAL_TABLET | Freq: Every day | ORAL | Status: AC | PRN

## 2024-06-13 ENCOUNTER — Encounter

## 2024-06-13 ENCOUNTER — Telehealth: Payer: Self-pay | Admitting: Family Medicine

## 2024-06-13 ENCOUNTER — Ambulatory Visit

## 2024-06-13 ENCOUNTER — Other Ambulatory Visit (INDEPENDENT_AMBULATORY_CARE_PROVIDER_SITE_OTHER)

## 2024-06-13 DIAGNOSIS — D649 Anemia, unspecified: Secondary | ICD-10-CM | POA: Diagnosis not present

## 2024-06-13 NOTE — Telephone Encounter (Signed)
 Copied from CRM [id number]. Topic: Appointments - Scheduling Inquiry for Clinic >> Jun 13, 2024  9:40 AM Vena HERO wrote: Reason for CRM: Pt called in about her Medicare AWV and was unaware that it had been canceled and rescheduled for 01/26. She would like a callback from the nurse to discuss this and would also like to inquire if she could just have a telephone visit instead due to living over an hour away

## 2024-06-14 LAB — CBC WITH DIFFERENTIAL/PLATELET
Basophils Absolute: 0 K/uL (ref 0.0–0.1)
Basophils Relative: 0.4 % (ref 0.0–3.0)
Eosinophils Absolute: 0.2 K/uL (ref 0.0–0.7)
Eosinophils Relative: 2.9 % (ref 0.0–5.0)
HCT: 33.9 % — ABNORMAL LOW (ref 36.0–46.0)
Hemoglobin: 11.4 g/dL — ABNORMAL LOW (ref 12.0–15.0)
Lymphocytes Relative: 41.1 % (ref 12.0–46.0)
Lymphs Abs: 2.6 K/uL (ref 0.7–4.0)
MCHC: 33.6 g/dL (ref 30.0–36.0)
MCV: 82.4 fl (ref 78.0–100.0)
Monocytes Absolute: 0.4 K/uL (ref 0.1–1.0)
Monocytes Relative: 6.6 % (ref 3.0–12.0)
Neutro Abs: 3.1 K/uL (ref 1.4–7.7)
Neutrophils Relative %: 49 % (ref 43.0–77.0)
Platelets: 257 K/uL (ref 150.0–400.0)
RBC: 4.11 Mil/uL (ref 3.87–5.11)
RDW: 17.2 % — ABNORMAL HIGH (ref 11.5–15.5)
WBC: 6.4 K/uL (ref 4.0–10.5)

## 2024-06-14 LAB — FERRITIN: Ferritin: 8 ng/mL — ABNORMAL LOW (ref 10.0–291.0)

## 2024-06-18 ENCOUNTER — Ambulatory Visit: Payer: Self-pay | Admitting: Family Medicine

## 2024-06-18 ENCOUNTER — Encounter: Payer: Self-pay | Admitting: Family Medicine

## 2024-06-19 NOTE — Telephone Encounter (Signed)
 Stacie Huff I just got off the phone with the patient when I called concerning results.  Patient let me know that she had sent a mychart message concerning her AWV.  She is concerned and does not want to be penalized for not having an AWV in 2025.  She has had then rescheduled a few times.  She would prefer to have the visit virtually because she lives out of town.  Can this be scheduled with someone  else if she can't get in with you?

## 2024-06-19 NOTE — Telephone Encounter (Signed)
 Copied from CRM #[id number]. Topic: Clinical - Lab/Test Results >> Jun 19, 2024 10:19 AM Donna BRAVO wrote: Reason for CRM: patient returning call from [facility] >> Jun 19, 2024 10:24 AM Donna BRAVO wrote: Call dropped when conferencing patient with CAL, CAL stated Rosina will call patient back. called patient back relayed message   Dr. Cleatus patient denies any symptoms (dypsnea, chest pain, fatigue) or passing of blood.  She did not tolerate the iron  supplements, caused nausea and constipation.

## 2024-06-20 NOTE — Telephone Encounter (Signed)
 Thanks for checking on this and helping this patient.

## 2024-06-21 ENCOUNTER — Other Ambulatory Visit: Payer: Self-pay | Admitting: Family Medicine

## 2024-07-11 ENCOUNTER — Encounter: Payer: Self-pay | Admitting: Family Medicine

## 2024-07-14 ENCOUNTER — Telehealth: Payer: Self-pay

## 2024-07-14 DIAGNOSIS — D509 Iron deficiency anemia, unspecified: Secondary | ICD-10-CM

## 2024-07-14 DIAGNOSIS — D649 Anemia, unspecified: Secondary | ICD-10-CM

## 2024-07-14 NOTE — Telephone Encounter (Signed)
 Copied from CRM #[id number]. Topic: Clinical - Lab/Test Results >> Jul 14, 2024  2:56 PM Nessti S wrote: Reason for CRM: pt returning call to receive lab results. She would like a call back soon as possible

## 2024-07-16 NOTE — Telephone Encounter (Signed)
 I pasted my prev lab result comments.  Please call pt.  Thanks.   =====================  Her ferritin is still low. She is still mildly anemic. This is similar to prior. Has she been passing any blood or had any unexpected bleeding? Was she able to tolerate iron ? If so, how much iron  is she taking? Please let me know about all of that.   It would be reasonable to consider iron  infusion through hematology.  Let me know if she consents for that.   It would be reasonable to recheck her stool for blood with an IFOB kit versus see GI.  My preference would be to see the GI clinic.  Please let me know which way she wants to go.  Thanks.

## 2024-07-19 NOTE — Telephone Encounter (Signed)
 Called and spoke with pt.  She was not able to tolerate iron , caused stomach pain and nausea.  Pt wishes to hold off on iron  infusions at this time, her only symptom of anemia is being cold and that is unchanged from her normal.  She denies fatigue, weakness, dyspnea and dizziness.  Is open to GI referral but knows it will take some time to get in to be seen, asked to have labs re-drawn when appropriate to be sure that there has been no change.  She reports that her last colonoscopy was in 2019 with [facility].

## 2024-07-24 NOTE — Telephone Encounter (Signed)
 I put in the lab orders to recheck them in about 1 month.  Please help her get a lab visit.    I put in the GI referral.  Please let me know if she cannot get an appointment.  Thanks.

## 2024-07-24 NOTE — Addendum Note (Signed)
 Addended by: CLEATUS LORELI RAMAN on: 07/24/2024 12:05 AM   Modules accepted: Orders

## 2024-07-25 NOTE — Telephone Encounter (Signed)
 Left message to return call to office. Please schedule Lab visit for Iron  and Fasting CPE.

## 2024-07-26 NOTE — Telephone Encounter (Signed)
 Please help her get a lab appointment.   She doesn't need CPE with me now since we addressed health maintenance at the visit on 04/06/24.  Please schedule medicare visit if needed with nurse advisor if needed.

## 2024-07-31 ENCOUNTER — Ambulatory Visit: Payer: Self-pay

## 2024-07-31 ENCOUNTER — Other Ambulatory Visit (INDEPENDENT_AMBULATORY_CARE_PROVIDER_SITE_OTHER)

## 2024-07-31 VITALS — BP 124/74 | Ht 62.5 in | Wt 148.0 lb

## 2024-07-31 DIAGNOSIS — Z0000 Encounter for general adult medical examination without abnormal findings: Secondary | ICD-10-CM

## 2024-07-31 DIAGNOSIS — D649 Anemia, unspecified: Secondary | ICD-10-CM

## 2024-07-31 DIAGNOSIS — D509 Iron deficiency anemia, unspecified: Secondary | ICD-10-CM

## 2024-07-31 LAB — CBC WITH DIFFERENTIAL/PLATELET
Basophils Absolute: 0 K/uL (ref 0.0–0.1)
Basophils Relative: 0.7 % (ref 0.0–3.0)
Eosinophils Absolute: 0.2 K/uL (ref 0.0–0.7)
Eosinophils Relative: 3.1 % (ref 0.0–5.0)
HCT: 35 % — ABNORMAL LOW (ref 36.0–46.0)
Hemoglobin: 12 g/dL (ref 12.0–15.0)
Lymphocytes Relative: 38.6 % (ref 12.0–46.0)
Lymphs Abs: 2.6 K/uL (ref 0.7–4.0)
MCHC: 34.3 g/dL (ref 30.0–36.0)
MCV: 84.9 fl (ref 78.0–100.0)
Monocytes Absolute: 0.6 K/uL (ref 0.1–1.0)
Monocytes Relative: 8.4 % (ref 3.0–12.0)
Neutro Abs: 3.3 K/uL (ref 1.4–7.7)
Neutrophils Relative %: 49.2 % (ref 43.0–77.0)
Platelets: 248 K/uL (ref 150.0–400.0)
RBC: 4.12 Mil/uL (ref 3.87–5.11)
RDW: 15.3 % (ref 11.5–15.5)
WBC: 6.6 K/uL (ref 4.0–10.5)

## 2024-07-31 LAB — FERRITIN: Ferritin: 6.4 ng/mL — ABNORMAL LOW (ref 10.0–291.0)

## 2024-07-31 NOTE — Patient Instructions (Signed)
 Stacie Huff,  Thank you for taking the time for your Medicare Wellness Visit. I appreciate your continued commitment to your health goals. Please review the care plan we discussed, and feel free to reach out if I can assist you further.  Please note that Annual Wellness Visits do not include a physical exam. Some assessments may be limited, especially if the visit was conducted virtually. If needed, we may recommend an in-person follow-up with your provider.  Ongoing Care Seeing your primary care provider every 3 to 6 months helps us  monitor your health and provide consistent, personalized care.   Referrals If a referral was made during today's visit and you haven't received any updates within two weeks, please contact the referred provider directly to check on the status.  Recommended Screenings:  Health Maintenance  Topic Date Due   Zoster (Shingles) Vaccine (1 of 2) Never done   Flu Shot  02/11/2024   COVID-19 Vaccine (5 - 2025-26 season) 03/13/2024   Medicare Annual Wellness Visit  04/11/2024   Breast Cancer Screening  12/19/2024   DTaP/Tdap/Td vaccine (2 - Td or Tdap) 10/15/2026   Colon Cancer Screening  02/23/2028   Pneumococcal Vaccine for age over 63  Completed   Osteoporosis screening with Bone Density Scan  Completed   Hepatitis C Screening  Completed   Meningitis B Vaccine  Aged Out       07/24/2024    9:21 AM  Advanced Directives  Does Patient Have a Medical Advance Directive? Yes  Type of Estate Agent of [place];Living will;Out of facility DNR (pink MOST or yellow form)  Does patient want to make changes to medical advance directive? No - Patient declined  Copy of Healthcare Power of Attorney in Chart? No - copy requested    Vision: Annual vision screenings are recommended for early detection of glaucoma, cataracts, and diabetic retinopathy. These exams can also reveal signs of chronic conditions such as diabetes and high blood  pressure.  Dental: Annual dental screenings help detect early signs of oral cancer, gum disease, and other conditions linked to overall health, including heart disease and diabetes.  Please see the attached documents for additional preventive care recommendations.

## 2024-07-31 NOTE — Progress Notes (Signed)
 " I connected with  Stacie Huff on 07/31/24 by a audio enabled telemedicine application and verified that I am speaking with the correct person using two identifiers.  Patient Location: Home  Provider Location: Home Office  Persons Participating in Visit: Patient.  I discussed the limitations of evaluation and management by telemedicine. The patient expressed understanding and agreed to proceed.  Vital Signs: Because this visit was a virtual/telehealth visit, some criteria may be missing or patient reported. Any vitals not documented were not able to be obtained and vitals that have been documented are patient reported.  Chief Complaint  Patient presents with   Medicare Wellness     Subjective:   Stacie Huff is a 74 y.o. female who presents for a Medicare Annual Wellness Visit.  Visit info / Clinical Intake: Medicare Wellness Visit Type:: Subsequent Annual Wellness Visit Persons participating in visit and providing information:: patient Medicare Wellness Visit Mode:: Telephone If telephone:: video declined Since this visit was completed virtually, some vitals may be partially provided or unavailable. Missing vitals are due to the limitations of the virtual format.: Documented vitals are patient reported If Telephone or Video please confirm:: I connected with patient using audio/video enable telemedicine. I verified patient identity with two identifiers, discussed telehealth limitations, and patient agreed to proceed. Patient Location:: home Provider Location:: home office Interpreter Needed?: No Pre-visit prep was completed: yes AWV questionnaire completed by patient prior to visit?: yes Date:: 07/28/24 Living arrangements:: (Patient-Rptd) lives with spouse/significant other Patient's Overall Health Status Rating: (Patient-Rptd) very good Typical amount of pain: (Patient-Rptd) some Does pain affect daily life?: (Patient-Rptd) no Are you currently prescribed opioids?: no  Dietary  Habits and Nutritional Risks How many meals a day?: (Patient-Rptd) 3 Eats fruit and vegetables daily?: (Patient-Rptd) yes Most meals are obtained by: (Patient-Rptd) preparing own meals In the last 2 weeks, have you had any of the following?: none Diabetic:: no  Functional Status Activities of Daily Living (to include ambulation/medication): (Patient-Rptd) Independent Ambulation: (Patient-Rptd) Independent Medication Administration: (Patient-Rptd) Independent Home Management (perform basic housework or laundry): (Patient-Rptd) Independent Manage your own finances?: (Patient-Rptd) yes Primary transportation is: (Patient-Rptd) driving Concerns about vision?: no *vision screening is required for WTM* Concerns about hearing?: no  Fall Screening Falls in the past year?: 0 Number of falls in past year: (Patient-Rptd) 0 Was there an injury with Fall?: 0 Fall Risk Category Calculator: 0 Patient Fall Risk Level: Low Fall Risk  Fall Risk Patient at Risk for Falls Due to: No Fall Risks Fall risk Follow up: Falls prevention discussed; Education provided; Falls evaluation completed  Home and Transportation Safety: All rugs have non-skid backing?: (Patient-Rptd) yes All stairs or steps have railings?: (Patient-Rptd) yes Grab bars in the bathtub or shower?: (Patient-Rptd) yes Have non-skid surface in bathtub or shower?: (Patient-Rptd) yes Good home lighting?: (Patient-Rptd) yes Regular seat belt use?: (Patient-Rptd) yes Hospital stays in the last year:: (Patient-Rptd) no  Cognitive Assessment Difficulty concentrating, remembering, or making decisions? : (Patient-Rptd) yes Will 6CIT or Mini Cog be Completed: yes What year is it?: 0 points What month is it?: 0 points Give patient an address phrase to remember (5 components): remember words apple , table, penny About what time is it?: 0 points Count backwards from 20 to 1: 0 points Say the months of the year in reverse: 0 points Repeat the  address phrase from earlier: 0 points 6 CIT Score: 0 points  Advance Directives (For Healthcare) Does Patient Have a Medical Advance Directive?: Yes Does patient want  to make changes to medical advance directive?: No - Patient declined Type of Advance Directive: Healthcare Power of [place]; Living will; Out of facility DNR (pink MOST or yellow form) Copy of Healthcare Power of Attorney in Chart?: No - copy requested Copy of Living Will in Chart?: No - copy requested Out of facility DNR (pink MOST or yellow form) in Chart? (Ambulatory ONLY): No - copy requested  Reviewed/Updated  Reviewed/Updated: Reviewed All (Medical, Surgical, Family, Medications, Allergies, Care Teams, Patient Goals)    Allergies (verified) Demerol [meperidine], Gabapentin, Pneumococcal vaccines, and Vicodin [hydrocodone-acetaminophen ]   Current Medications (verified) Outpatient Encounter Medications as of 07/31/2024  Medication Sig   Calcium Carb-Cholecalciferol (CALCIUM 600/VITAMIN D3 PO) Take by mouth daily.   celecoxib  (CELEBREX ) 100 MG capsule Take 1 capsule (100 mg total) by mouth daily.   estradiol  (ESTRACE ) 1 MG tablet Take 0.5 tablets (0.5 mg total) by mouth daily.   hydrochlorothiazide  (HYDRODIURIL ) 25 MG tablet Take 0.5-1 tablets (12.5-25 mg total) by mouth daily as needed (for edema.).   OVER THE COUNTER MEDICATION daily. Fish oil 1200, omega 3- 600 mg, with vitamin d 3- 2000 units   oxybutynin  (DITROPAN -XL) 5 MG 24 hr tablet Take 1 tablet (5 mg total) by mouth daily.   potassium chloride  (KLOR-CON ) 10 MEQ tablet Take 1 tablet (10 mEq total) by mouth daily.   methocarbamol  (ROBAXIN ) 500 MG tablet Take 500 mg by mouth 4 (four) times daily.   No facility-administered encounter medications on file as of 07/31/2024.    History: Past Medical History:  Diagnosis Date   Anemia    Arthritis    History of postmenopausal HRT    Urine, incontinence, stress female    Past Surgical History:  Procedure  Laterality Date   ABDOMINAL HYSTERECTOMY     APPENDECTOMY  1960   BLADDER REPAIR  1999   BLADDER REPAIR  2004   BREAST ENHANCEMENT SURGERY  1982   CHOLECYSTECTOMY  1991   LAMINECTOMY  3 total.  1990 x1, 2000 x2   OOPHORECTOMY Right    PARTIAL HYSTERECTOMY  1982   SPINE SURGERY     TONSILLECTOMY AND ADENOIDECTOMY  1956   Family History  Problem Relation Age of Onset   Sarcoidosis Mother    Diabetes Mother    Hypertension Mother    Asthma Mother    Heart disease Mother    Colon cancer Father        at age 20   Cancer Father    Arthritis Father    Hearing loss Father    Varicose Veins Father    Pulmonary Hypertension Sister    COPD Sister    Depression Sister    Diabetes Sister    Hypertension Sister    Hyperlipidemia Sister    Asthma Sister    Obesity Sister    Breast cancer Neg Hx    Esophageal cancer Neg Hx    Rectal cancer Neg Hx    Stomach cancer Neg Hx    Social History   Occupational History   Not on file  Tobacco Use   Smoking status: Never   Smokeless tobacco: Never  Vaping Use   Vaping status: Never Used  Substance and Sexual Activity   Alcohol use: Never   Drug use: Never   Sexual activity: Not Currently    Birth control/protection: Post-menopausal, Surgical   Tobacco Counseling Counseling given: Not Answered  SDOH Screenings   Food Insecurity: No Food Insecurity (07/24/2024)  Housing: Low Risk (07/24/2024)  Transportation  Needs: No Transportation Needs (07/24/2024)  Utilities: Not At Risk (07/31/2024)  Alcohol Screen: Low Risk (04/12/2023)  Depression (PHQ2-9): Low Risk (07/31/2024)  Financial Resource Strain: Low Risk (07/24/2024)  Physical Activity: Insufficiently Active (07/24/2024)  Social Connections: Socially Integrated (07/31/2024)  Stress: No Stress Concern Present (07/24/2024)  Tobacco Use: Low Risk (07/31/2024)  Health Literacy: Adequate Health Literacy (07/31/2024)   See flowsheets for full screening details  Depression Screen PHQ 2 &  9 Depression Scale- Over the past 2 weeks, how often have you been bothered by any of the following problems? Little interest or pleasure in doing things: 0 Feeling down, depressed, or hopeless (PHQ Adolescent also includes...irritable): 0 PHQ-2 Total Score: 0 Trouble falling or staying asleep, or sleeping too much: 0 Feeling tired or having little energy: 0 Poor appetite or overeating (PHQ Adolescent also includes...weight loss): 0 Feeling bad about yourself - or that you are a failure or have let yourself or your family down: 0 Trouble concentrating on things, such as reading the newspaper or watching television (PHQ Adolescent also includes...like school work): 0 Moving or speaking so slowly that other people could have noticed. Or the opposite - being so fidgety or restless that you have been moving around a lot more than usual: 0 Thoughts that you would be better off dead, or of hurting yourself in some way: 0 PHQ-9 Total Score: 0 If you checked off any problems, how difficult have these problems made it for you to do your work, take care of things at home, or get along with other people?: Not difficult at all  Depression Treatment Depression Interventions/Treatment : [id number] Score <4 Follow-up Not Indicated     Goals Addressed               This Visit's Progress     Patient Stated (pt-stated)        To have better balance in her life              Objective:    Today's Vitals   07/31/24 1622  BP: 124/74  Weight: 148 lb (67.1 kg)  Height: 5' 2.5 (1.588 m)   Body mass index is 26.64 kg/m.  Hearing/Vision screen Hearing Screening - Comments:: No difficulties  Vision Screening - Comments:: Patient wears one contact  Immunizations and Health Maintenance Health Maintenance  Topic Date Due   Zoster Vaccines- Shingrix (1 of 2) Never done   Influenza Vaccine  02/11/2024   COVID-19 Vaccine (5 - 2025-26 season) 03/13/2024   Medicare Annual Wellness (AWV)  04/11/2024    Mammogram  12/19/2024   DTaP/Tdap/Td (2 - Td or Tdap) 10/15/2026   Colonoscopy  02/23/2028   Pneumococcal Vaccine: 50+ Years  Completed   Bone Density Scan  Completed   Hepatitis C Screening  Completed   Meningococcal B Vaccine  Aged Out        Assessment/Plan:  This is a routine wellness examination for [place].  Patient Care Team: Cleatus Arlyss RAMAN, MD as PCP - General (Family Medicine)  I have personally reviewed and noted the following in the patients chart:   Medical and social history Use of alcohol, tobacco or illicit drugs  Current medications and supplements including opioid prescriptions. Functional ability and status Nutritional status Physical activity Advanced directives List of other physicians Hospitalizations, surgeries, and ER visits in previous 12 months Vitals Screenings to include cognitive, depression, and falls Referrals and appointments  No orders of the defined types were placed in this encounter.  In addition,  I have reviewed and discussed with patient certain preventive protocols, quality metrics, and best practice recommendations. A written personalized care plan for preventive services as well as general preventive health recommendations were provided to patient.   Lyle MARLA Right, [place]   07/31/2024   No follow-ups on file.  After Visit Summary: (MyChart) Due to this being a telephonic visit, the after visit summary with patients personalized plan was offered to patient via MyChart   Nurse Notes:no voiced concerns , patient declined vaccinations "

## 2024-08-02 ENCOUNTER — Ambulatory Visit: Payer: Self-pay | Admitting: Family Medicine

## 2024-08-30 ENCOUNTER — Ambulatory Visit: Admitting: Physician Assistant
